# Patient Record
Sex: Female | Born: 1980 | Race: White | Hispanic: No | Marital: Single | Smoking: Current some day smoker
Health system: Southern US, Community
[De-identification: ages and names within clinical notes are randomized; demographics above are authoritative.]

## PROBLEM LIST (undated history)

## (undated) DIAGNOSIS — F32A Depression, unspecified: Secondary | ICD-10-CM

## (undated) DIAGNOSIS — F329 Major depressive disorder, single episode, unspecified: Secondary | ICD-10-CM

## (undated) DIAGNOSIS — F419 Anxiety disorder, unspecified: Secondary | ICD-10-CM

## (undated) DIAGNOSIS — N83209 Unspecified ovarian cyst, unspecified side: Secondary | ICD-10-CM

## (undated) HISTORY — PX: TUBAL LIGATION: SHX77

---
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
## 2001-07-29 ENCOUNTER — Emergency Department (HOSPITAL_COMMUNITY): Admission: EM | Admit: 2001-07-29 | Discharge: 2001-07-29 | Payer: Self-pay | Admitting: Emergency Medicine

## 2001-07-29 ENCOUNTER — Encounter: Payer: Self-pay | Admitting: *Deleted

## 2001-09-06 ENCOUNTER — Emergency Department (HOSPITAL_COMMUNITY): Admission: AC | Admit: 2001-09-06 | Discharge: 2001-09-06 | Payer: Self-pay

## 2001-09-06 ENCOUNTER — Encounter: Payer: Self-pay | Admitting: Emergency Medicine

## 2002-04-10 ENCOUNTER — Emergency Department (HOSPITAL_COMMUNITY): Admission: EM | Admit: 2002-04-10 | Discharge: 2002-04-10 | Payer: Self-pay | Admitting: Emergency Medicine

## 2002-05-15 ENCOUNTER — Inpatient Hospital Stay (HOSPITAL_COMMUNITY): Admission: AD | Admit: 2002-05-15 | Discharge: 2002-05-15 | Payer: Self-pay | Admitting: *Deleted

## 2002-09-21 ENCOUNTER — Emergency Department (HOSPITAL_COMMUNITY): Admission: EM | Admit: 2002-09-21 | Discharge: 2002-09-21 | Payer: Self-pay | Admitting: Emergency Medicine

## 2002-11-02 ENCOUNTER — Emergency Department (HOSPITAL_COMMUNITY): Admission: EM | Admit: 2002-11-02 | Discharge: 2002-11-02 | Payer: Self-pay | Admitting: Emergency Medicine

## 2002-11-07 ENCOUNTER — Emergency Department (HOSPITAL_COMMUNITY): Admission: EM | Admit: 2002-11-07 | Discharge: 2002-11-07 | Payer: Self-pay | Admitting: Emergency Medicine

## 2002-12-07 ENCOUNTER — Encounter: Payer: Self-pay | Admitting: Emergency Medicine

## 2002-12-07 ENCOUNTER — Emergency Department (HOSPITAL_COMMUNITY): Admission: EM | Admit: 2002-12-07 | Discharge: 2002-12-07 | Payer: Self-pay | Admitting: Emergency Medicine

## 2002-12-15 ENCOUNTER — Emergency Department (HOSPITAL_COMMUNITY): Admission: EM | Admit: 2002-12-15 | Discharge: 2002-12-15 | Payer: Self-pay | Admitting: Emergency Medicine

## 2003-01-10 ENCOUNTER — Encounter: Payer: Self-pay | Admitting: Emergency Medicine

## 2003-01-10 ENCOUNTER — Emergency Department (HOSPITAL_COMMUNITY): Admission: EM | Admit: 2003-01-10 | Discharge: 2003-01-10 | Payer: Self-pay | Admitting: Emergency Medicine

## 2003-03-13 ENCOUNTER — Encounter: Payer: Self-pay | Admitting: Emergency Medicine

## 2003-03-13 ENCOUNTER — Emergency Department (HOSPITAL_COMMUNITY): Admission: EM | Admit: 2003-03-13 | Discharge: 2003-03-13 | Payer: Self-pay | Admitting: Emergency Medicine

## 2003-03-15 ENCOUNTER — Inpatient Hospital Stay (HOSPITAL_COMMUNITY): Admission: AD | Admit: 2003-03-15 | Discharge: 2003-03-15 | Payer: Self-pay | Admitting: *Deleted

## 2003-08-25 ENCOUNTER — Inpatient Hospital Stay (HOSPITAL_COMMUNITY): Admission: AD | Admit: 2003-08-25 | Discharge: 2003-08-25 | Payer: Self-pay | Admitting: Family Medicine

## 2003-11-08 ENCOUNTER — Emergency Department (HOSPITAL_COMMUNITY): Admission: EM | Admit: 2003-11-08 | Discharge: 2003-11-08 | Payer: Self-pay | Admitting: Emergency Medicine

## 2003-11-09 ENCOUNTER — Inpatient Hospital Stay (HOSPITAL_COMMUNITY): Admission: AD | Admit: 2003-11-09 | Discharge: 2003-11-09 | Payer: Self-pay | Admitting: Obstetrics and Gynecology

## 2004-04-17 ENCOUNTER — Emergency Department (HOSPITAL_COMMUNITY): Admission: EM | Admit: 2004-04-17 | Discharge: 2004-04-17 | Payer: Self-pay | Admitting: Emergency Medicine

## 2004-04-21 ENCOUNTER — Emergency Department (HOSPITAL_COMMUNITY): Admission: EM | Admit: 2004-04-21 | Discharge: 2004-04-21 | Payer: Self-pay | Admitting: *Deleted

## 2004-05-23 ENCOUNTER — Emergency Department (HOSPITAL_COMMUNITY): Admission: EM | Admit: 2004-05-23 | Discharge: 2004-05-23 | Payer: Self-pay | Admitting: Emergency Medicine

## 2004-05-24 ENCOUNTER — Emergency Department (HOSPITAL_COMMUNITY): Admission: EM | Admit: 2004-05-24 | Discharge: 2004-05-24 | Payer: Self-pay | Admitting: Emergency Medicine

## 2004-05-27 ENCOUNTER — Emergency Department (HOSPITAL_COMMUNITY): Admission: EM | Admit: 2004-05-27 | Discharge: 2004-05-27 | Payer: Self-pay | Admitting: Emergency Medicine

## 2004-08-05 ENCOUNTER — Emergency Department (HOSPITAL_COMMUNITY): Admission: EM | Admit: 2004-08-05 | Discharge: 2004-08-05 | Payer: Self-pay | Admitting: Emergency Medicine

## 2005-07-06 ENCOUNTER — Emergency Department (HOSPITAL_COMMUNITY): Admission: EM | Admit: 2005-07-06 | Discharge: 2005-07-07 | Payer: Self-pay | Admitting: Emergency Medicine

## 2005-12-23 ENCOUNTER — Emergency Department (HOSPITAL_COMMUNITY): Admission: EM | Admit: 2005-12-23 | Discharge: 2005-12-24 | Payer: Self-pay | Admitting: Emergency Medicine

## 2006-05-27 ENCOUNTER — Emergency Department (HOSPITAL_COMMUNITY): Admission: EM | Admit: 2006-05-27 | Discharge: 2006-05-27 | Payer: Self-pay | Admitting: Emergency Medicine

## 2006-05-30 ENCOUNTER — Emergency Department (HOSPITAL_COMMUNITY): Admission: EM | Admit: 2006-05-30 | Discharge: 2006-05-30 | Payer: Self-pay | Admitting: Emergency Medicine

## 2006-06-14 ENCOUNTER — Emergency Department (HOSPITAL_COMMUNITY): Admission: EM | Admit: 2006-06-14 | Discharge: 2006-06-14 | Payer: Self-pay | Admitting: Emergency Medicine

## 2006-10-05 ENCOUNTER — Emergency Department (HOSPITAL_COMMUNITY): Admission: EM | Admit: 2006-10-05 | Discharge: 2006-10-05 | Payer: Self-pay | Admitting: Emergency Medicine

## 2006-12-05 ENCOUNTER — Emergency Department (HOSPITAL_COMMUNITY): Admission: EM | Admit: 2006-12-05 | Discharge: 2006-12-05 | Payer: Self-pay | Admitting: Emergency Medicine

## 2007-05-06 ENCOUNTER — Emergency Department (HOSPITAL_COMMUNITY): Admission: EM | Admit: 2007-05-06 | Discharge: 2007-05-06 | Payer: Self-pay | Admitting: Emergency Medicine

## 2007-05-18 ENCOUNTER — Emergency Department (HOSPITAL_COMMUNITY): Admission: EM | Admit: 2007-05-18 | Discharge: 2007-05-18 | Payer: Self-pay | Admitting: Emergency Medicine

## 2007-09-18 ENCOUNTER — Emergency Department (HOSPITAL_COMMUNITY): Admission: EM | Admit: 2007-09-18 | Discharge: 2007-09-18 | Payer: Self-pay | Admitting: Emergency Medicine

## 2008-04-29 ENCOUNTER — Emergency Department (HOSPITAL_COMMUNITY): Admission: EM | Admit: 2008-04-29 | Discharge: 2008-04-30 | Payer: Self-pay | Admitting: Emergency Medicine

## 2008-04-30 ENCOUNTER — Emergency Department (HOSPITAL_COMMUNITY): Admission: EM | Admit: 2008-04-30 | Discharge: 2008-04-30 | Payer: Self-pay | Admitting: Emergency Medicine

## 2008-07-24 ENCOUNTER — Inpatient Hospital Stay (HOSPITAL_COMMUNITY): Admission: AD | Admit: 2008-07-24 | Discharge: 2008-07-24 | Payer: Self-pay | Admitting: Obstetrics and Gynecology

## 2008-10-19 ENCOUNTER — Inpatient Hospital Stay (HOSPITAL_COMMUNITY): Admission: AD | Admit: 2008-10-19 | Discharge: 2008-10-20 | Payer: Self-pay | Admitting: Obstetrics & Gynecology

## 2008-11-02 ENCOUNTER — Inpatient Hospital Stay (HOSPITAL_COMMUNITY): Admission: AD | Admit: 2008-11-02 | Discharge: 2008-11-02 | Payer: Self-pay | Admitting: Obstetrics & Gynecology

## 2008-11-24 ENCOUNTER — Ambulatory Visit: Payer: Self-pay | Admitting: Advanced Practice Midwife

## 2008-11-24 ENCOUNTER — Inpatient Hospital Stay (HOSPITAL_COMMUNITY): Admission: AD | Admit: 2008-11-24 | Discharge: 2008-11-24 | Payer: Self-pay | Admitting: Obstetrics & Gynecology

## 2008-12-07 ENCOUNTER — Inpatient Hospital Stay (HOSPITAL_COMMUNITY): Admission: AD | Admit: 2008-12-07 | Discharge: 2008-12-07 | Payer: Self-pay | Admitting: Obstetrics

## 2008-12-08 ENCOUNTER — Inpatient Hospital Stay (HOSPITAL_COMMUNITY): Admission: AD | Admit: 2008-12-08 | Discharge: 2008-12-08 | Payer: Self-pay | Admitting: Obstetrics and Gynecology

## 2008-12-23 ENCOUNTER — Inpatient Hospital Stay (HOSPITAL_COMMUNITY): Admission: AD | Admit: 2008-12-23 | Discharge: 2008-12-23 | Payer: Self-pay | Admitting: Obstetrics

## 2008-12-31 ENCOUNTER — Inpatient Hospital Stay (HOSPITAL_COMMUNITY): Admission: AD | Admit: 2008-12-31 | Discharge: 2009-01-02 | Payer: Self-pay | Admitting: Obstetrics & Gynecology

## 2009-09-19 ENCOUNTER — Encounter: Admission: RE | Admit: 2009-09-19 | Discharge: 2009-09-19 | Payer: Self-pay | Admitting: Family Medicine

## 2009-12-07 ENCOUNTER — Emergency Department (HOSPITAL_COMMUNITY): Admission: EM | Admit: 2009-12-07 | Discharge: 2009-12-08 | Payer: Self-pay | Admitting: Emergency Medicine

## 2009-12-08 ENCOUNTER — Ambulatory Visit (HOSPITAL_COMMUNITY): Admission: RE | Admit: 2009-12-08 | Discharge: 2009-12-08 | Payer: Self-pay | Admitting: Emergency Medicine

## 2010-01-28 ENCOUNTER — Emergency Department (HOSPITAL_COMMUNITY): Admission: EM | Admit: 2010-01-28 | Discharge: 2010-01-28 | Payer: Self-pay | Admitting: Emergency Medicine

## 2010-04-15 ENCOUNTER — Emergency Department (HOSPITAL_COMMUNITY): Admission: EM | Admit: 2010-04-15 | Discharge: 2010-04-15 | Payer: Self-pay | Admitting: Emergency Medicine

## 2010-05-20 ENCOUNTER — Emergency Department (HOSPITAL_COMMUNITY): Admission: EM | Admit: 2010-05-20 | Discharge: 2010-05-20 | Payer: Self-pay | Admitting: Emergency Medicine

## 2010-05-30 ENCOUNTER — Emergency Department (HOSPITAL_COMMUNITY): Admission: EM | Admit: 2010-05-30 | Discharge: 2010-05-30 | Payer: Self-pay | Admitting: Emergency Medicine

## 2010-10-08 LAB — APTT: aPTT: 32 seconds (ref 24–37)

## 2010-10-08 LAB — CBC
HCT: 39.9 % (ref 36.0–46.0)
Hemoglobin: 13.5 g/dL (ref 12.0–15.0)
MCV: 95 fL (ref 78.0–100.0)
Platelets: 347 10*3/uL (ref 150–400)
WBC: 8.4 10*3/uL (ref 4.0–10.5)

## 2010-10-08 LAB — DIFFERENTIAL
Basophils Absolute: 0 10*3/uL (ref 0.0–0.1)
Eosinophils Relative: 1 % (ref 0–5)
Lymphocytes Relative: 22 % (ref 12–46)
Lymphs Abs: 1.8 10*3/uL (ref 0.7–4.0)
Monocytes Absolute: 0.6 10*3/uL (ref 0.1–1.0)
Monocytes Relative: 7 % (ref 3–12)
Neutro Abs: 5.9 10*3/uL (ref 1.7–7.7)

## 2010-10-08 LAB — BASIC METABOLIC PANEL
GFR calc non Af Amer: >60 mL/min (ref >60)
Sodium: 140 mEq/L (ref 135–145)

## 2010-10-08 LAB — WET PREP, GENITAL

## 2010-10-08 LAB — GC/CHLAMYDIA PROBE AMP, GENITAL
Chlamydia, DNA Probe: NEGATIVE
GC Probe Amp, Genital: NEGATIVE

## 2010-10-08 LAB — HCG, QUANTITATIVE, PREGNANCY: hCG, Beta Chain, Quant, S: 115 m[IU]/mL — ABNORMAL HIGH (ref <5)

## 2010-10-08 LAB — PROTIME-INR
INR: 1.02 (ref 0.00–1.49)
Prothrombin Time: 13.6 seconds (ref 11.6–15.2)

## 2010-10-08 LAB — ABO/RH: ABO/RH(D): O POS

## 2010-10-11 LAB — DIFFERENTIAL
Basophils Absolute: 0.1 10*3/uL (ref 0.0–0.1)
Basophils Relative: 1 % (ref 0–1)
Eosinophils Absolute: 0.2 10*3/uL (ref 0.0–0.7)
Eosinophils Relative: 3 % (ref 0–5)
Lymphocytes Relative: 32 % (ref 12–46)
Monocytes Absolute: 0.5 10*3/uL (ref 0.1–1.0)

## 2010-10-11 LAB — URINALYSIS, ROUTINE W REFLEX MICROSCOPIC
Nitrite: NEGATIVE
Specific Gravity, Urine: 1.025 (ref 1.005–1.030)
Urobilinogen, UA: 0.2 mg/dL (ref 0.0–1.0)
pH: 6.5 (ref 5.0–8.0)

## 2010-10-11 LAB — COMPREHENSIVE METABOLIC PANEL
AST: 16 U/L (ref 0–37)
BUN: 8 mg/dL (ref 6–23)
Chloride: 109 mEq/L (ref 96–112)
GFR calc Af Amer: >60 mL/min (ref >60)

## 2010-10-11 LAB — POCT PREGNANCY, URINE: Preg Test, Ur: NEGATIVE

## 2010-10-11 LAB — CBC
Hemoglobin: 13.8 g/dL (ref 12.0–15.0)
Platelets: 386 10*3/uL (ref 150–400)
RBC: 4.39 MIL/uL (ref 3.87–5.11)
RDW: 14.1 % (ref 11.5–15.5)

## 2010-10-12 LAB — COMPREHENSIVE METABOLIC PANEL
ALT: 18 U/L (ref 0–35)
AST: 19 U/L (ref 0–37)
Alkaline Phosphatase: 66 U/L (ref 39–117)
CO2: 26 mEq/L (ref 19–32)
Chloride: 107 mEq/L (ref 96–112)
GFR calc Af Amer: >60 mL/min (ref >60)
GFR calc non Af Amer: >60 mL/min (ref >60)
Potassium: 3.8 mEq/L (ref 3.5–5.1)
Sodium: 142 mEq/L (ref 135–145)
Total Bilirubin: 0.4 mg/dL (ref 0.3–1.2)

## 2010-10-12 LAB — DIFFERENTIAL
Basophils Absolute: 0.1 10*3/uL (ref 0.0–0.1)
Basophils Relative: 1 % (ref 0–1)
Eosinophils Absolute: 0.4 10*3/uL (ref 0.0–0.7)
Eosinophils Relative: 5 % (ref 0–5)
Lymphs Abs: 2.7 10*3/uL (ref 0.7–4.0)

## 2010-10-12 LAB — CBC
Hemoglobin: 15.3 g/dL — ABNORMAL HIGH (ref 12.0–15.0)
RBC: 4.9 MIL/uL (ref 3.87–5.11)
WBC: 8.5 10*3/uL (ref 4.0–10.5)

## 2010-10-12 LAB — URINALYSIS, ROUTINE W REFLEX MICROSCOPIC
Bilirubin Urine: NEGATIVE
Hgb urine dipstick: NEGATIVE
Ketones, ur: NEGATIVE mg/dL
Specific Gravity, Urine: 1.015 (ref 1.005–1.030)
Urobilinogen, UA: 0.2 mg/dL (ref 0.0–1.0)

## 2010-10-12 LAB — LIPASE, BLOOD: Lipase: 50 U/L (ref 11–59)

## 2010-11-02 LAB — CBC
HCT: 29.7 % — ABNORMAL LOW (ref 36.0–46.0)
Hemoglobin: 11.6 g/dL — ABNORMAL LOW (ref 12.0–15.0)
MCHC: 35 g/dL (ref 30.0–36.0)
MCV: 96.7 fL (ref 78.0–100.0)
MCV: 97 fL (ref 78.0–100.0)
Platelets: 286 10*3/uL (ref 150–400)
RBC: 3.43 MIL/uL — ABNORMAL LOW (ref 3.87–5.11)
RDW: 13.3 % (ref 11.5–15.5)

## 2010-11-02 LAB — RPR: RPR Ser Ql: NONREACTIVE

## 2010-11-03 LAB — CBC
HCT: 30.9 % — ABNORMAL LOW (ref 36.0–46.0)
MCV: 97 fL (ref 78.0–100.0)
RBC: 3.18 MIL/uL — ABNORMAL LOW (ref 3.87–5.11)
WBC: 11.1 10*3/uL — ABNORMAL HIGH (ref 4.0–10.5)

## 2010-11-03 LAB — URINALYSIS, ROUTINE W REFLEX MICROSCOPIC
Glucose, UA: NEGATIVE mg/dL
Hgb urine dipstick: NEGATIVE
Ketones, ur: NEGATIVE mg/dL
Nitrite: NEGATIVE
Nitrite: NEGATIVE
Protein, ur: NEGATIVE mg/dL
Specific Gravity, Urine: 1.015 (ref 1.005–1.030)
Urobilinogen, UA: 0.2 mg/dL (ref 0.0–1.0)

## 2010-11-03 LAB — COMPREHENSIVE METABOLIC PANEL
BUN: 3 mg/dL — ABNORMAL LOW (ref 6–23)
CO2: 22 mEq/L (ref 19–32)
Chloride: 109 mEq/L (ref 96–112)
Creatinine, Ser: 0.33 mg/dL — ABNORMAL LOW (ref 0.4–1.2)
GFR calc non Af Amer: >60 mL/min (ref >60)
Total Bilirubin: 0.2 mg/dL — ABNORMAL LOW (ref 0.3–1.2)

## 2010-11-03 LAB — WET PREP, GENITAL

## 2010-11-03 LAB — URIC ACID: Uric Acid, Serum: 3.3 mg/dL (ref 2.4–7.0)

## 2010-11-04 LAB — URINALYSIS, ROUTINE W REFLEX MICROSCOPIC
Glucose, UA: 100 mg/dL — AB
Nitrite: NEGATIVE
Protein, ur: NEGATIVE mg/dL
Urobilinogen, UA: 0.2 mg/dL (ref 0.0–1.0)

## 2010-11-04 LAB — STREP B DNA PROBE: Strep Group B Ag: NEGATIVE

## 2010-11-04 LAB — GC/CHLAMYDIA PROBE AMP, GENITAL
Chlamydia, DNA Probe: NEGATIVE
GC Probe Amp, Genital: NEGATIVE

## 2010-11-04 LAB — WET PREP, GENITAL: Yeast Wet Prep HPF POC: NONE SEEN

## 2010-11-04 LAB — FETAL FIBRONECTIN: Fetal Fibronectin: NEGATIVE

## 2010-11-05 LAB — URINE CULTURE: Culture: NO GROWTH

## 2010-11-05 LAB — URINALYSIS, ROUTINE W REFLEX MICROSCOPIC
Bilirubin Urine: NEGATIVE
Ketones, ur: NEGATIVE mg/dL
Nitrite: NEGATIVE
Protein, ur: NEGATIVE mg/dL

## 2010-11-05 LAB — URINE MICROSCOPIC-ADD ON: RBC / HPF: NONE SEEN RBC/hpf (ref <3)

## 2010-11-30 ENCOUNTER — Inpatient Hospital Stay (HOSPITAL_COMMUNITY)
Admission: AD | Admit: 2010-11-30 | Discharge: 2010-12-01 | Disposition: A | Discharge status: Home / Self Care | Payer: Self-pay | Source: Ambulatory Visit | Attending: Obstetrics & Gynecology | Admitting: Obstetrics & Gynecology

## 2010-11-30 DIAGNOSIS — O200 Threatened abortion: Secondary | ICD-10-CM | POA: Insufficient documentation

## 2010-12-01 ENCOUNTER — Inpatient Hospital Stay (HOSPITAL_COMMUNITY): Payer: Self-pay

## 2010-12-01 LAB — CBC
HCT: 34.5 % — ABNORMAL LOW (ref 36.0–46.0)
Platelets: 270 10*3/uL (ref 150–400)
RDW: 12.9 % (ref 11.5–15.5)
WBC: 7.3 10*3/uL (ref 4.0–10.5)

## 2010-12-01 LAB — HCG, QUANTITATIVE, PREGNANCY: hCG, Beta Chain, Quant, S: 42553 m[IU]/mL — ABNORMAL HIGH (ref <5)

## 2010-12-01 LAB — ABO/RH: ABO/RH(D): O POS

## 2010-12-08 NOTE — H&P (Signed)
Beverly Mason, STANKOWSKI NO.:  [id number]   MEDICAL RECORD NO.:  [id number]          PATIENT TYPE:   LOCATION:                                FACILITY:  [facility]   PHYSICIAN:  Lenoard Aden, M.D.     DATE OF BIRTH:   DATE OF ADMISSION:  07/24/2008  DATE OF DISCHARGE:                              HISTORY & PHYSICAL   CHIEF COMPLAINT:  Pelvic pain.   This 30 year old white female G2, P1 at [redacted] weeks gestation with lower  abdominal cramping who presents by EMS for  this complaint.   MEDICATIONS:  __________   SOCIAL HISTORY:  She is a nonsmoker, nondrinker.  She denies domestic  violence. __________history of alcohol abuse.  Lives in __________.   PAST MEDICAL HISTORY:  Bipolar disorder.   She has a history of vaginal delivery.   ALLERGIES:  She has allergies to aspirin and PENICILLIN.   PHYSICAL EXAMINATION:  GENERAL:  The patient is a complicated white  female in no acute distress.  VITAL SIGNS:  Stable.  She is afebrile.  HEENT:  Normal.  LUNGS:  Clear.  ABDOMEN:  Soft, gravid, and nontender to palpation. No rebound, guarding  __________   IMPRESSION:  16 week OB who has alcohol intoxication __________   Urinalysis negative.  __________.   PLAN:  Do tox screen and discharge  home.      Lenoard Aden, M.D.  Electronically Signed     RJT/MEDQ  D:  07/24/2008  T:  07/25/2008  Job:  [id number]

## 2010-12-08 NOTE — Consult Note (Signed)
Beverly Mason, Beverly Mason              ACCOUNT NO.:  [id number]   MEDICAL RECORD NO.:  [id number]          PATIENT TYPE:  [facility]   LOCATION:  [facility]                          FACILITY:  [facility]   PHYSICIAN:  Lenoard Aden, M.D.DATE OF BIRTH:  Dec 23, 1980   DATE OF CONSULTATION:  11/02/2008  DATE OF DISCHARGE:  11/02/2008                                 CONSULTATION   CHIEF COMPLAINT:  Lower abdominal pain.   HISTORY OF PRESENT ILLNESS:  She is a 30 year old G2, P1 at 96 plus  weeks' gestation, who presents with questionably leakage of fluid and  abdominal discomfort this evening.   PAST MEDICAL HISTORY:  Remarkable for pneumonia, varicella, bipolar  disorder, UTI, history of term vaginal delivery complicated by  preeclampsia in 2005.   FAMILY HISTORY:  Noncontributory.   SOCIAL HISTORY:  She is a nonsmoker.  She denies domestic or physical  violence.   PHYSICAL EXAMINATION:  VITAL SIGNS:  Stable.  GENERAL:  The patient is afebrile.  HEENT:  Normal.  LUNGS:  Clear.  HEART:  Regular rhythm.  ABDOMEN:  Soft, gravid, and nontender.  BACK:  No CVA tenderness.  EXTREMITIES:  There are no cords.  NEUROLOGIC:  Nonfocal.  SKIN:  Intact.   Cervix was closed, long and out of the pelvis, contractions are  irregular.  NST is reassuring to reactive.  Ultrasound reveals an  otherwise normal-appearing fetus growth.  Scan was not.  AFI is 6.3.  Fetal fibronectin was negative.  Urinalysis and wet prep negative.  GC  chlamydia pending.   IMPRESSION:  1. A 30-week intrauterine pregnancy.  2. Preterm contractions.  No other cervical change.  On serial exam,      negative fetal fibronectin.  3. New-onset oligohydramnios, questionable etiology.   PLAN:  Follow up in the office for full obstetrical ultrasound within  the week to check AFI and check growth.  Preterm labor warnings given,  fetal activity discussed.  The patient acknowledges, we will proceed.      Lenoard Aden, M.D.  Electronically Signed     RJT/MEDQ  D:  11/02/2008  T:  11/03/2008  Job:  [id number]

## 2011-02-11 ENCOUNTER — Other Ambulatory Visit: Payer: Self-pay | Admitting: Advanced Practice Midwife

## 2011-02-11 ENCOUNTER — Inpatient Hospital Stay (HOSPITAL_COMMUNITY): Payer: Medicaid Other

## 2011-02-11 ENCOUNTER — Encounter (HOSPITAL_COMMUNITY): Payer: Self-pay | Admitting: *Deleted

## 2011-02-11 ENCOUNTER — Inpatient Hospital Stay (HOSPITAL_COMMUNITY)
Admission: AD | Admit: 2011-02-11 | Discharge: 2011-02-12 | Disposition: A | Discharge status: Home / Self Care | Payer: Medicaid Other | Source: Ambulatory Visit | Attending: Family Medicine | Admitting: Family Medicine

## 2011-02-11 DIAGNOSIS — O4690 Antepartum hemorrhage, unspecified, unspecified trimester: Secondary | ICD-10-CM

## 2011-02-11 DIAGNOSIS — O209 Hemorrhage in early pregnancy, unspecified: Secondary | ICD-10-CM | POA: Insufficient documentation

## 2011-02-11 DIAGNOSIS — R109 Unspecified abdominal pain: Secondary | ICD-10-CM | POA: Insufficient documentation

## 2011-02-11 DIAGNOSIS — Z3490 Encounter for supervision of normal pregnancy, unspecified, unspecified trimester: Secondary | ICD-10-CM

## 2011-02-11 DIAGNOSIS — M549 Dorsalgia, unspecified: Secondary | ICD-10-CM | POA: Insufficient documentation

## 2011-02-11 LAB — URINE MICROSCOPIC-ADD ON

## 2011-02-11 LAB — URINALYSIS, ROUTINE W REFLEX MICROSCOPIC
Bilirubin Urine: NEGATIVE
Glucose, UA: NEGATIVE mg/dL
Ketones, ur: NEGATIVE mg/dL
pH: 7 (ref 5.0–8.0)

## 2011-02-11 LAB — WET PREP, GENITAL: Yeast Wet Prep HPF POC: NONE SEEN

## 2011-02-11 MED ORDER — OXYCODONE-ACETAMINOPHEN 5-325 MG PO TABS
2.0000 | ORAL_TABLET | Freq: Once | ORAL | Status: AC
  Administered 2011-02-11: 2 via ORAL
  Filled 2011-02-11: qty 2

## 2011-02-11 NOTE — Progress Notes (Signed)
Had Dr. Visit Monday.  Started bleeding Tuesday.  Called the Dr. Isidore Moos and they told her to come in.  States bleeding is random, sometimes a couple teaspoons sometimes none

## 2011-02-11 NOTE — ED Provider Notes (Signed)
History     Chief Complaint  Patient presents with  . Abdominal Pain   HPI The patient is a 30 y.o. G3P2002 at [redacted]w[redacted]d with c/o vaginal bleeding and low abd/back pain. Pt reports bleeding is intermittent and started on Tuesday. Had intercourse on Tuesday, bleeding occurred prior to IC, then stopped, then started again after IC. Pt reports she was told that she had a low lying placenta covering about 1/4 of her cervix that was seen on her anatomy u/s. She receives prenatal care in [place]. Last visit on Monday.   OB History    Grav Para Term Preterm Abortions TAB SAB Ect Mult Living   3 2 2  0 0 0 0 0 0 2      No past medical history on file.  No past surgical history on file.  No family history on file.  History  Substance Use Topics  . Smoking status: Not on file  . Smokeless tobacco: Not on file  . Alcohol Use:     Allergies:  Allergies  Allergen Reactions  . Aspirin     Nausea and vomiting  . Ibuprofen     Nausea and vomiting    Prescriptions prior to admission  Medication Sig Dispense Refill  . acetaminophen (TYLENOL) 325 MG tablet Take 650 mg by mouth every 6 (six) hours as needed. For headache or pain       . prenatal vitamin w/FE, FA (PRENATAL 1 + 1) 27-1 MG TABS Take 1 tablet by mouth daily.          Review of Systems  Constitutional: Negative.   Respiratory: Negative.   Cardiovascular: Negative.   Gastrointestinal: Negative for nausea, vomiting, abdominal pain, diarrhea and constipation.  Genitourinary: Negative for dysuria, urgency, frequency, hematuria and flank pain.       Positive for vaginal bleeding and cramping  Musculoskeletal: Positive for back pain.  Neurological: Negative.   Psychiatric/Behavioral: Negative.    Physical Exam   Blood pressure 110/69, pulse 99, temperature 99 F (37.2 C), temperature source Oral, resp. rate 20, height 5\' 5"  (1.651 m), weight 62.506 kg (137 lb 12.8 oz), last menstrual period 08/31/2010.  Physical Exam    Nursing note and vitals reviewed. Constitutional: She is oriented to person, place, and time. She appears well-developed and well-nourished. No distress.  HENT:  Head: Normocephalic and atraumatic.  Cardiovascular: Normal rate.   Respiratory: Effort normal.  GI: Soft. Bowel sounds are normal. She exhibits no mass. There is no tenderness. There is no rebound and no guarding.  Genitourinary: There is no rash or lesion on the right labia. There is no rash or lesion on the left labia. Uterus is not tender. Enlarged: Size c/w dates. Cervix exhibits no motion tenderness, no discharge and no friability. Right adnexum displays no mass, no tenderness and no fullness. Left adnexum displays no mass, no tenderness and no fullness. No tenderness or bleeding around the vagina. Vaginal discharge (creamy brown discharge) found.       Cervix visually closed  Musculoskeletal: Normal range of motion.  Neurological: She is alert and oriented to person, place, and time.  Skin: Skin is warm and dry.  Psychiatric: She has a normal mood and affect.    MAU Course  Procedures Urine GC/CT collected Wet prep collected U/S ordered to eval placenta  Results for orders placed during the hospital encounter of 02/11/11 (from the past 24 hour(s))  WET PREP, GENITAL     Status: Abnormal   Collection Time  02/11/11  8:20 PM      Component Value Range   Yeast, Wet Prep NONE SEEN  NONE SEEN    Trich, Wet Prep NONE SEEN  NONE SEEN    Clue Cells, Wet Prep NONE SEEN  NONE SEEN    WBC, Wet Prep HPF POC FEW (*) NONE SEEN   URINALYSIS, ROUTINE W REFLEX MICROSCOPIC     Status: Abnormal   Collection Time   02/11/11 10:04 PM      Component Value Range   Color, Urine YELLOW  YELLOW    Appearance CLOUDY (*) CLEAR    Specific Gravity, Urine 1.015  1.005 - 1.030    pH 7.0  5.0 - 8.0    Glucose, UA NEGATIVE  NEGATIVE (mg/dL)   Hgb urine dipstick TRACE (*) NEGATIVE    Bilirubin Urine NEGATIVE  NEGATIVE    Ketones, ur NEGATIVE   NEGATIVE (mg/dL)   Protein, ur NEGATIVE  NEGATIVE (mg/dL)   Urobilinogen, UA 0.2  0.0 - 1.0 (mg/dL)   Nitrite NEGATIVE  NEGATIVE    Leukocytes, UA NEGATIVE  NEGATIVE   URINE MICROSCOPIC-ADD ON     Status: Abnormal   Collection Time   02/11/11 10:04 PM      Component Value Range   Squamous Epithelial / LPF RARE  RARE    WBC, UA 0-2  <3 (WBC/hpf)   Bacteria, UA MANY (*) RARE    Urine-Other MUCOUS PRESENT      U/S WNL, placenta well away from internal os, no abruption, EFW 54%ile, AFV normal, cervical length 4 cm    Assessment and Plan  30 yo G3P2002 @ [redacted]w[redacted]d Bleeding in pregnancy Rev'd precautions, f/u with regular provider as scheduled  Rickey Farrier 02/11/2011, 10:32 PM

## 2011-02-12 LAB — GC/CHLAMYDIA PROBE AMP, URINE: Chlamydia, Swab/Urine, PCR: NEGATIVE

## 2011-04-26 LAB — CBC
MCHC: 34.7
Platelets: 412 — ABNORMAL HIGH
RBC: 3.93
WBC: 11.5 — ABNORMAL HIGH

## 2011-04-26 LAB — HCG, QUANTITATIVE, PREGNANCY: hCG, Beta Chain, Quant, S: 138 — ABNORMAL HIGH

## 2011-04-26 LAB — DIFFERENTIAL
Basophils Relative: 1
Lymphs Abs: 2
Monocytes Relative: 6
Neutro Abs: 8.4 — ABNORMAL HIGH
Neutrophils Relative %: 73

## 2011-04-26 LAB — GC/CHLAMYDIA PROBE AMP, GENITAL: GC Probe Amp, Genital: NEGATIVE

## 2011-04-26 LAB — ABO/RH: ABO/RH(D): O POS

## 2011-04-26 LAB — BASIC METABOLIC PANEL
BUN: 9
Calcium: 8.7
Creatinine, Ser: 0.47
GFR calc Af Amer: >60

## 2011-04-29 LAB — DIFFERENTIAL
Basophils Absolute: 0 10*3/uL (ref 0.0–0.1)
Basophils Relative: 0 % (ref 0–1)
Eosinophils Absolute: 0.1 10*3/uL (ref 0.0–0.7)
Eosinophils Relative: 1 % (ref 0–5)
Lymphs Abs: 1.9 10*3/uL (ref 0.7–4.0)
Neutrophils Relative %: 68 % (ref 43–77)

## 2011-04-29 LAB — URINALYSIS, ROUTINE W REFLEX MICROSCOPIC
Glucose, UA: NEGATIVE mg/dL
Hgb urine dipstick: NEGATIVE
Ketones, ur: NEGATIVE mg/dL
Protein, ur: NEGATIVE mg/dL
pH: 5 (ref 5.0–8.0)

## 2011-04-29 LAB — CBC
HCT: 33.8 % — ABNORMAL LOW (ref 36.0–46.0)
MCV: 93.9 fL (ref 78.0–100.0)
Platelets: 294 10*3/uL (ref 150–400)
RDW: 12.5 % (ref 11.5–15.5)
WBC: 7.6 10*3/uL (ref 4.0–10.5)

## 2011-04-29 LAB — RAPID URINE DRUG SCREEN, HOSP PERFORMED
Barbiturates: NOT DETECTED
Benzodiazepines: NOT DETECTED
Cocaine: NOT DETECTED

## 2011-04-29 LAB — WET PREP, GENITAL

## 2011-05-05 LAB — URINALYSIS, ROUTINE W REFLEX MICROSCOPIC
Bilirubin Urine: NEGATIVE
Hgb urine dipstick: NEGATIVE
Nitrite: NEGATIVE
Specific Gravity, Urine: 1.01
pH: 7

## 2011-05-05 LAB — DIFFERENTIAL
Basophils Relative: 1
Lymphs Abs: 2.2
Monocytes Absolute: 0.4
Monocytes Relative: 8
Neutro Abs: 2.3
Neutrophils Relative %: 43

## 2011-05-05 LAB — CBC
Hemoglobin: 12.5
MCHC: 33.8
RBC: 3.98
WBC: 5.3

## 2011-05-05 LAB — PREGNANCY, URINE: Preg Test, Ur: NEGATIVE

## 2011-05-06 LAB — CBC
MCHC: 34.4
Platelets: 277
RDW: 13.1

## 2011-05-06 LAB — DIFFERENTIAL
Basophils Relative: 0
Eosinophils Absolute: 0.2
Lymphs Abs: 2.2
Monocytes Absolute: 0.4
Monocytes Relative: 7
Neutro Abs: 3.1
Neutrophils Relative %: 52

## 2011-05-06 LAB — WET PREP, GENITAL
Trich, Wet Prep: NONE SEEN
WBC, Wet Prep HPF POC: NONE SEEN
Yeast Wet Prep HPF POC: NONE SEEN

## 2011-05-06 LAB — COMPREHENSIVE METABOLIC PANEL
ALT: 10
Albumin: 4.1
Alkaline Phosphatase: 35 — ABNORMAL LOW
Calcium: 8.9
Glucose, Bld: 85
Potassium: 4.4
Sodium: 138
Total Protein: 6.6

## 2011-05-06 LAB — URINALYSIS, ROUTINE W REFLEX MICROSCOPIC
Glucose, UA: NEGATIVE
Ketones, ur: NEGATIVE
Nitrite: NEGATIVE
Specific Gravity, Urine: 1.02
pH: 8

## 2011-05-06 LAB — GC/CHLAMYDIA PROBE AMP, GENITAL: Chlamydia, DNA Probe: NEGATIVE

## 2011-05-06 LAB — PREGNANCY, URINE: Preg Test, Ur: NEGATIVE

## 2011-06-27 ENCOUNTER — Emergency Department (HOSPITAL_COMMUNITY): Payer: Medicaid Other

## 2011-06-27 ENCOUNTER — Encounter (HOSPITAL_COMMUNITY): Payer: Self-pay | Admitting: Emergency Medicine

## 2011-06-27 ENCOUNTER — Emergency Department (HOSPITAL_COMMUNITY)
Admission: EM | Admit: 2011-06-27 | Discharge: 2011-06-27 | Disposition: A | Discharge status: Home / Self Care | Payer: Medicaid Other | Attending: Emergency Medicine | Admitting: Emergency Medicine

## 2011-06-27 DIAGNOSIS — N949 Unspecified condition associated with female genital organs and menstrual cycle: Secondary | ICD-10-CM | POA: Insufficient documentation

## 2011-06-27 DIAGNOSIS — F1010 Alcohol abuse, uncomplicated: Secondary | ICD-10-CM | POA: Insufficient documentation

## 2011-06-27 DIAGNOSIS — R109 Unspecified abdominal pain: Secondary | ICD-10-CM | POA: Insufficient documentation

## 2011-06-27 DIAGNOSIS — N939 Abnormal uterine and vaginal bleeding, unspecified: Secondary | ICD-10-CM

## 2011-06-27 DIAGNOSIS — N898 Other specified noninflammatory disorders of vagina: Secondary | ICD-10-CM | POA: Insufficient documentation

## 2011-06-27 DIAGNOSIS — M25539 Pain in unspecified wrist: Secondary | ICD-10-CM | POA: Insufficient documentation

## 2011-06-27 DIAGNOSIS — M25439 Effusion, unspecified wrist: Secondary | ICD-10-CM | POA: Insufficient documentation

## 2011-06-27 DIAGNOSIS — S63509A Unspecified sprain of unspecified wrist, initial encounter: Secondary | ICD-10-CM | POA: Insufficient documentation

## 2011-06-27 DIAGNOSIS — F10929 Alcohol use, unspecified with intoxication, unspecified: Secondary | ICD-10-CM

## 2011-06-27 LAB — CBC
Hemoglobin: 13.8 g/dL (ref 12.0–15.0)
MCH: 31.7 pg (ref 26.0–34.0)
MCHC: 34.2 g/dL (ref 30.0–36.0)
MCV: 92.7 fL (ref 78.0–100.0)
RBC: 4.36 MIL/uL (ref 3.87–5.11)

## 2011-06-27 LAB — BASIC METABOLIC PANEL
BUN: 7 mg/dL (ref 6–23)
CO2: 22 mEq/L (ref 19–32)
Calcium: 8.8 mg/dL (ref 8.4–10.5)
Creatinine, Ser: 0.54 mg/dL (ref 0.50–1.10)
GFR calc non Af Amer: >90 mL/min (ref >90)
Glucose, Bld: 87 mg/dL (ref 70–99)
Sodium: 143 mEq/L (ref 135–145)

## 2011-06-27 MED ORDER — SODIUM CHLORIDE 0.9 % IV BOLUS (SEPSIS)
1000.0000 mL | Freq: Once | INTRAVENOUS | Status: AC
  Administered 2011-06-27: 1000 mL via INTRAVENOUS

## 2011-06-27 MED ORDER — IOHEXOL 300 MG/ML  SOLN
80.0000 mL | Freq: Once | INTRAMUSCULAR | Status: AC | PRN
  Administered 2011-06-27: 80 mL via INTRAVENOUS

## 2011-06-27 MED ORDER — ACETAMINOPHEN 325 MG PO TABS
650.0000 mg | ORAL_TABLET | Freq: Once | ORAL | Status: AC
  Administered 2011-06-27: 650 mg via ORAL
  Filled 2011-06-27: qty 2

## 2011-06-27 NOTE — ED Notes (Signed)
Patient transported to CT 

## 2011-06-27 NOTE — ED Notes (Signed)
EDP notified that pt is requesting tylenol

## 2011-06-27 NOTE — ED Notes (Signed)
Pt notified for 3rd time that urine sample is needed
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
## 2011-06-27 NOTE — ED Notes (Signed)
Re-paged Dr. Jed Limerick to [phone]

## 2011-06-28 LAB — URINE DRUGS OF ABUSE SCREEN W ALC, ROUTINE (REF LAB)
Benzodiazepines.: NEGATIVE
Cocaine Metabolites: NEGATIVE
Creatinine,U: 26.5 mg/dL
Methadone: NEGATIVE
Phencyclidine (PCP): NEGATIVE

## 2011-06-29 ENCOUNTER — Other Ambulatory Visit: Payer: Self-pay | Admitting: Obstetrics & Gynecology

## 2011-06-29 ENCOUNTER — Other Ambulatory Visit (HOSPITAL_COMMUNITY): Payer: Medicaid Other

## 2011-06-29 ENCOUNTER — Inpatient Hospital Stay (HOSPITAL_COMMUNITY): Payer: Medicaid Other

## 2011-06-29 ENCOUNTER — Encounter (HOSPITAL_COMMUNITY): Payer: Self-pay

## 2011-06-29 ENCOUNTER — Ambulatory Visit: Admit: 2011-06-29 | Payer: Self-pay | Admitting: Obstetrics & Gynecology

## 2011-06-29 ENCOUNTER — Ambulatory Visit (HOSPITAL_COMMUNITY)
Admission: AD | Admit: 2011-06-29 | Discharge: 2011-06-29 | Disposition: A | Discharge status: Home / Self Care | Payer: Medicaid Other | Source: Ambulatory Visit | Attending: Obstetrics & Gynecology | Admitting: Obstetrics & Gynecology

## 2011-06-29 ENCOUNTER — Encounter (HOSPITAL_COMMUNITY)
Admission: AD | Disposition: A | Discharge status: Home / Self Care | Payer: Self-pay | Source: Ambulatory Visit | Attending: Obstetrics & Gynecology

## 2011-06-29 DIAGNOSIS — N939 Abnormal uterine and vaginal bleeding, unspecified: Secondary | ICD-10-CM

## 2011-06-29 DIAGNOSIS — Z9889 Other specified postprocedural states: Secondary | ICD-10-CM

## 2011-06-29 DIAGNOSIS — N898 Other specified noninflammatory disorders of vagina: Secondary | ICD-10-CM

## 2011-06-29 HISTORY — DX: Depression, unspecified: F32.A

## 2011-06-29 HISTORY — DX: Major depressive disorder, single episode, unspecified: F32.9

## 2011-06-29 HISTORY — DX: Anxiety disorder, unspecified: F41.9

## 2011-06-29 HISTORY — PX: DILATION AND EVACUATION: SHX1459

## 2011-06-29 LAB — CBC
HCT: 37.4 % (ref 36.0–46.0)
HCT: 31.8 % — ABNORMAL LOW (ref 36.0–46.0)
Hemoglobin: 12 g/dL (ref 12.0–15.0)
Hemoglobin: 10.3 g/dL — ABNORMAL LOW (ref 12.0–15.0)
MCH: 31.1 pg (ref 26.0–34.0)
MCH: 30.7 pg (ref 26.0–34.0)
MCHC: 33.2 g/dL (ref 30.0–36.0)
MCV: 93.5 fL (ref 78.0–100.0)
MCV: 95.4 fL (ref 78.0–100.0)
MCV: 94.9 fL (ref 78.0–100.0)
Platelets: 380 10*3/uL (ref 150–400)
RBC: 4.15 MIL/uL (ref 3.87–5.11)
RBC: 3.35 MIL/uL — ABNORMAL LOW (ref 3.87–5.11)
RDW: 13.8 % (ref 11.5–15.5)
RDW: 13.8 % (ref 11.5–15.5)
WBC: 6.6 10*3/uL (ref 4.0–10.5)

## 2011-06-29 LAB — DIC (DISSEMINATED INTRAVASCULAR COAGULATION)PANEL
Fibrinogen: 292 mg/dL (ref 204–475)
Prothrombin Time: 13.8 seconds (ref 11.6–15.2)
Smear Review: NONE SEEN

## 2011-06-29 LAB — TYPE AND SCREEN: ABO/RH(D): O POS

## 2011-06-29 SURGERY — DILATION AND EVACUATION, UTERUS
Anesthesia: Monitor Anesthesia Care | Site: Vagina | Wound class: Clean Contaminated

## 2011-06-29 MED ORDER — DOXYCYCLINE HYCLATE 100 MG IV SOLR
100.0000 mg | Freq: Once | INTRAVENOUS | Status: AC
  Administered 2011-06-29: 100 mg via INTRAVENOUS
  Filled 2011-06-29: qty 100

## 2011-06-29 MED ORDER — OXYTOCIN 20 UNITS IN LACTATED RINGERS INFUSION - SIMPLE
20.0000 [IU] | INTRAVENOUS | Status: DC
  Administered 2011-06-29: 20 [IU] via INTRAVENOUS

## 2011-06-29 MED ORDER — MISOPROSTOL 200 MCG PO TABS
ORAL_TABLET | ORAL | Status: AC
  Administered 2011-06-29: 800 ug via RECTAL
  Filled 2011-06-29: qty 4

## 2011-06-29 MED ORDER — DOCUSATE SODIUM 100 MG PO CAPS: 100.0000 mg | ORAL_CAPSULE | Freq: Two times a day (BID) | ORAL | Status: AC | PRN

## 2011-06-29 MED ORDER — ACETAMINOPHEN 500 MG PO TABS: 1000.0000 mg | ORAL_TABLET | Freq: Four times a day (QID) | ORAL | Status: DC | PRN

## 2011-06-29 MED ORDER — OXYCODONE-ACETAMINOPHEN 5-325 MG PO TABS: 1.0000 | ORAL_TABLET | Freq: Four times a day (QID) | ORAL | Status: DC | PRN

## 2011-06-29 MED ORDER — MENTHOL 3 MG MT LOZG: 1.0000 | LOZENGE | OROMUCOSAL | Status: DC | PRN

## 2011-06-29 MED ORDER — ONDANSETRON HCL 4 MG PO TABS: 4.0000 mg | ORAL_TABLET | Freq: Four times a day (QID) | ORAL | Status: DC | PRN

## 2011-06-29 MED ORDER — HYDROMORPHONE HCL 2 MG PO TABS
2.0000 mg | ORAL_TABLET | ORAL | Status: DC | PRN
  Administered 2011-06-29: 2 mg via ORAL
  Filled 2011-06-29: qty 1

## 2011-06-29 MED ORDER — DOXYCYCLINE HYCLATE 100 MG IV SOLR
100.0000 mg | Freq: Two times a day (BID) | INTRAVENOUS | Status: DC
  Filled 2011-06-29: qty 100

## 2011-06-29 MED ORDER — KETOROLAC TROMETHAMINE 30 MG/ML IJ SOLN
INTRAMUSCULAR | Status: DC | PRN
  Administered 2011-06-29: 30 mg via INTRAVENOUS

## 2011-06-29 MED ORDER — LIDOCAINE HCL (CARDIAC) 20 MG/ML IV SOLN
INTRAVENOUS | Status: DC | PRN
  Administered 2011-06-29: 40 mg via INTRAVENOUS

## 2011-06-29 MED ORDER — FAMOTIDINE IN NACL 20-0.9 MG/50ML-% IV SOLN
20.0000 mg | Freq: Once | INTRAVENOUS | Status: AC
  Administered 2011-06-29: 20 mg via INTRAVENOUS

## 2011-06-29 MED ORDER — MIDAZOLAM HCL 5 MG/5ML IJ SOLN
INTRAMUSCULAR | Status: DC | PRN
  Administered 2011-06-29: 2 mg via INTRAVENOUS

## 2011-06-29 MED ORDER — FENTANYL CITRATE 0.05 MG/ML IJ SOLN
50.0000 ug | Freq: Once | INTRAMUSCULAR | Status: AC
  Administered 2011-06-29: 50 ug via INTRAVENOUS
  Filled 2011-06-29: qty 2

## 2011-06-29 MED ORDER — HYDROMORPHONE HCL PF 1 MG/ML IJ SOLN
1.0000 mg | INTRAMUSCULAR | Status: DC | PRN
  Administered 2011-06-29 (×2): 1 mg via INTRAVENOUS
  Filled 2011-06-29 (×2): qty 1

## 2011-06-29 MED ORDER — CARBOPROST TROMETHAMINE 250 MCG/ML IM SOLN
250.0000 ug | INTRAMUSCULAR | Status: DC | PRN
  Administered 2011-06-29: 250 ug via INTRAMUSCULAR

## 2011-06-29 MED ORDER — FAMOTIDINE IN NACL 20-0.9 MG/50ML-% IV SOLN
INTRAVENOUS | Status: AC
  Filled 2011-06-29: qty 50

## 2011-06-29 MED ORDER — ONDANSETRON HCL 4 MG/2ML IJ SOLN: 4.0000 mg | Freq: Four times a day (QID) | INTRAMUSCULAR | Status: DC | PRN

## 2011-06-29 MED ORDER — ONDANSETRON HCL 4 MG/2ML IJ SOLN
INTRAMUSCULAR | Status: DC | PRN
  Administered 2011-06-29: 4 mg via INTRAVENOUS

## 2011-06-29 MED ORDER — HYDROMORPHONE HCL PF 1 MG/ML IJ SOLN
INTRAMUSCULAR | Status: AC
  Administered 2011-06-29: 1 mg via INTRAVENOUS
  Filled 2011-06-29: qty 1

## 2011-06-29 MED ORDER — PANTOPRAZOLE SODIUM 40 MG PO TBEC
40.0000 mg | DELAYED_RELEASE_TABLET | Freq: Every day | ORAL | Status: DC
  Administered 2011-06-29: 40 mg via ORAL
  Filled 2011-06-29 (×2): qty 1

## 2011-06-29 MED ORDER — OXYTOCIN 20 UNITS IN LACTATED RINGERS INFUSION - SIMPLE
40.0000 [IU] | INTRAVENOUS | Status: DC
  Filled 2011-06-29: qty 2000

## 2011-06-29 MED ORDER — CARBOPROST TROMETHAMINE 250 MCG/ML IM SOLN: 250.0000 ug | INTRAMUSCULAR | Status: DC | PRN

## 2011-06-29 MED ORDER — OXYCODONE-ACETAMINOPHEN 5-325 MG PO TABS
1.0000 | ORAL_TABLET | ORAL | Status: DC | PRN
  Administered 2011-06-29 (×2): 2 via ORAL
  Filled 2011-06-29 (×2): qty 2

## 2011-06-29 MED ORDER — LACTATED RINGERS IV SOLN: INTRAVENOUS | Status: DC

## 2011-06-29 MED ORDER — PROPOFOL 10 MG/ML IV BOLUS
INTRAVENOUS | Status: DC | PRN
  Administered 2011-06-29 (×2): 50 mg via INTRAVENOUS

## 2011-06-29 MED ORDER — ZOLPIDEM TARTRATE 5 MG PO TABS: 5.0000 mg | ORAL_TABLET | Freq: Every evening | ORAL | Status: DC | PRN

## 2011-06-29 MED ORDER — DOXYCYCLINE HYCLATE 100 MG IV SOLR
100.0000 mg | Freq: Once | INTRAVENOUS | Status: DC
  Filled 2011-06-29: qty 100

## 2011-06-29 MED ORDER — CARBOPROST TROMETHAMINE 250 MCG/ML IM SOLN
INTRAMUSCULAR | Status: AC
  Administered 2011-06-29: 250 ug via INTRAMUSCULAR
  Filled 2011-06-29: qty 1

## 2011-06-29 MED ORDER — IBUPROFEN 600 MG PO TABS: 600.0000 mg | ORAL_TABLET | Freq: Four times a day (QID) | ORAL | Status: DC | PRN

## 2011-06-29 MED ORDER — LIDOCAINE HCL 1 % IJ SOLN
INTRAMUSCULAR | Status: DC | PRN
  Administered 2011-06-29: 10 mL

## 2011-06-29 MED ORDER — HYDROMORPHONE HCL 2 MG PO TABS: 2.0000 mg | ORAL_TABLET | ORAL | Status: AC | PRN

## 2011-06-29 MED ORDER — MIDAZOLAM HCL 2 MG/2ML IJ SOLN
0.5000 mg | Freq: Once | INTRAMUSCULAR | Status: AC
  Administered 2011-06-29: 0.5 mg via INTRAVENOUS

## 2011-06-29 MED ORDER — FENTANYL CITRATE 0.05 MG/ML IJ SOLN
INTRAMUSCULAR | Status: DC | PRN
  Administered 2011-06-29: 100 ug via INTRAVENOUS

## 2011-06-29 MED ORDER — HYDROMORPHONE HCL PF 1 MG/ML IJ SOLN: 0.2500 mg | INTRAMUSCULAR | Status: DC | PRN

## 2011-06-29 MED ORDER — OXYTOCIN 10 UNIT/ML IJ SOLN
20.0000 [IU] | Freq: Once | INTRAMUSCULAR | Status: AC
  Administered 2011-06-29: 20 [IU]

## 2011-06-29 MED ORDER — DIPHENOXYLATE-ATROPINE 2.5-0.025 MG PO TABS: 2.0000 | ORAL_TABLET | Freq: Four times a day (QID) | ORAL | Status: DC | PRN

## 2011-06-29 MED ORDER — HYDROMORPHONE HCL PF 1 MG/ML IJ SOLN
1.0000 mg | INTRAMUSCULAR | Status: DC | PRN
  Administered 2011-06-29 (×3): 1 mg via INTRAVENOUS
  Filled 2011-06-29: qty 1

## 2011-06-29 MED ORDER — OXYTOCIN 10 UNIT/ML IJ SOLN
INTRAMUSCULAR | Status: AC
  Administered 2011-06-29: 20 [IU]
  Filled 2011-06-29: qty 2

## 2011-06-29 MED ORDER — MISOPROSTOL 200 MCG PO TABS
800.0000 ug | ORAL_TABLET | Freq: Once | ORAL | Status: AC
  Administered 2011-06-29: 800 ug via RECTAL

## 2011-06-29 MED ORDER — METHYLERGONOVINE MALEATE 0.2 MG/ML IJ SOLN
0.2000 mg | Freq: Four times a day (QID) | INTRAMUSCULAR | Status: DC | PRN
  Administered 2011-06-29: 0.2 mg via INTRAMUSCULAR

## 2011-06-29 MED ORDER — LACTATED RINGERS IV SOLN
INTRAVENOUS | Status: DC
  Administered 2011-06-29 (×3): via INTRAVENOUS

## 2011-06-29 MED ORDER — DOXYCYCLINE HYCLATE 100 MG PO CAPS: 100.0000 mg | ORAL_CAPSULE | Freq: Two times a day (BID) | ORAL | Status: AC

## 2011-06-29 MED ORDER — MIDAZOLAM HCL 2 MG/2ML IJ SOLN
INTRAMUSCULAR | Status: AC
  Administered 2011-06-29: 0.5 mg via INTRAVENOUS
  Filled 2011-06-29: qty 2

## 2011-06-29 MED ORDER — CLONAZEPAM 1 MG PO TABS: 1.0000 mg | ORAL_TABLET | Freq: Two times a day (BID) | ORAL | Status: DC | PRN

## 2011-06-29 MED ORDER — METHYLERGONOVINE MALEATE 0.2 MG/ML IJ SOLN: 0.2000 mg | INTRAMUSCULAR | Status: DC | PRN

## 2011-06-29 MED ORDER — OXYTOCIN 20 UNITS IN LACTATED RINGERS INFUSION - SIMPLE
INTRAVENOUS | Status: AC
  Administered 2011-06-29: 20 [IU] via INTRAVENOUS
  Filled 2011-06-29: qty 1000

## 2011-06-29 SURGICAL SUPPLY — 19 items
CATH ROBINSON RED A/P 16FR (CATHETERS) ×2 IMPLANT
CLOTH BEACON ORANGE TIMEOUT ST (SAFETY) ×2 IMPLANT
DECANTER SPIKE VIAL GLASS SM (MISCELLANEOUS) ×2 IMPLANT
GLOVE BIO SURGEON STRL SZ7 (GLOVE) ×2 IMPLANT
GLOVE BIOGEL PI IND STRL 7.0 (GLOVE) ×4 IMPLANT
GLOVE BIOGEL PI INDICATOR 7.0 (GLOVE) ×4
GOWN PREVENTION PLUS LG XLONG (DISPOSABLE) ×4 IMPLANT
KIT BERKELEY 1ST TRIMESTER 3/8 (MISCELLANEOUS) ×2 IMPLANT
NEEDLE SPNL 22GX3.5 QUINCKE BK (NEEDLE) ×2 IMPLANT
NS IRRIG 1000ML POUR BTL (IV SOLUTION) ×2 IMPLANT
PACK VAGINAL MINOR WOMEN LF (CUSTOM PROCEDURE TRAY) ×2 IMPLANT
PAD PREP 24X48 CUFFED NSTRL (MISCELLANEOUS) ×2 IMPLANT
SET BERKELEY SUCTION TUBING (SUCTIONS) ×2 IMPLANT
SYR CONTROL 10ML LL (SYRINGE) ×2 IMPLANT
TOWEL OR 17X24 6PK STRL BLUE (TOWEL DISPOSABLE) ×4 IMPLANT
VACURETTE 10 RIGID CVD (CANNULA) IMPLANT
VACURETTE 7MM CVD STRL WRAP (CANNULA) IMPLANT
VACURETTE 8 RIGID CVD (CANNULA) IMPLANT
VACURETTE 9 RIGID CVD (CANNULA) ×2 IMPLANT

## 2011-06-29 NOTE — Progress Notes (Signed)
Pt admitted from OR.  Assessment revealed small pool of blood around perineum.  Steady trickle of blood continued from vagina.  Phone call to Dr. Macon Large to update.  En route.  Additional assessment revealed large clot(size of grapefruit) on peripad with continued bleeding.
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
## 2011-06-29 NOTE — Progress Notes (Signed)
Patient's father called and voiced concerns about patient being discharged and being around the baby.  He said she had mental health issues and alcoholism and feels she needs help.  Patient denied any homicidal or suicidal ideation; and there is no reason to hold her against her will.  It was emphasized that the patient is the one who requested discharge; and given that she is stable, there is no further reason for inpatient observation, and we cannot keep her against her will.   He was told that we are not mental health providers, and she may be better served at [facility] or [facility] Health Services at [facility] or [facility]; patient lives in [place] and had her delivery at [place].    He said he will come to drive her home and is expected to be here in about one hour.  Patient's nurse Delorse Lek) informed of this phone conversation.  Will continue with patient's discharge as planned as long as she remains stable.  Jaynie Collins, M.D. 06/29/2011 6:41 PM

## 2011-06-29 NOTE — Progress Notes (Signed)
Dr Malen Gauze notified of pt's demeanor while awake.  Sedated, but when awakened for assessment, begins writhing and moaning stating she is hurting "down there".  Appears to be very anxious.  Order received for Versed 0.5 mg IV now.
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
## 2011-06-29 NOTE — Progress Notes (Signed)
Dr. Penne Lash and Dr. Macon Large at beside.  Orders received.  U/S ordered and called.  Labs ordered and called.

## 2011-06-29 NOTE — Transfer of Care (Signed)
Immediate Anesthesia Transfer of Care Note  Patient: [facility]  Procedure(s) Performed:  DILATATION AND EVACUATION (D&E)  Patient Location: PACU  Anesthesia Type: MAC  Level of Consciousness: oriented and sedated  Airway & Oxygen Therapy: Patient Spontanous Breathing  Post-op Assessment: Report given to PACU RN and Post -op Vital signs reviewed and stable  Post vital signs: stable  Complications: No apparent anesthesia complications
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
## 2011-06-29 NOTE — Progress Notes (Signed)
Beverly Mason, [facility] spoke with pt in regards to suicidal/ homicidal ideation after pt's father came to pick her up for discharge and voiced concerns about pt's mental status. Pt repeatedly denied both suicidal and homicidal ideation... Pt stated that her mother, who is coming down from [place], is her main support system. The pt also stated she wanted to go home to where he baby is, but was advised to stay the night at her father's house and wait for her mother to arrive. The pt was discharged with her father... The pt was taken to car via wheelchair by [employer]	[place], [place].
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
## 2011-06-29 NOTE — Progress Notes (Signed)
Was called to room by nurse tech. Pt was in bathroom .Pit was pt first time out of bed and she had passed several moderate sized clots. Changed pt undergarments and peripad. No active bleeding after peripad changed. Notified provider of clots and vitals
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
## 2011-06-29 NOTE — Op Note (Signed)
Beverly Mason PROCEDURE DATE: 06/29/2011  PREOPERATIVE DIAGNOSIS: Retained products of conception POSTOPERATIVE DIAGNOSIS: The same. PROCEDURE:     Dilation and Curretage. SURGEON:  Dr. Penne Lash ASSISTANT: Dr Adrian Blackwater  INDICATIONS: 30 y.o. [id number] with retained products of conception at 3 weeks postpartum, needing surgical completion.  Risks of surgery were discussed with the patient including but not limited to: bleeding which may require transfusion; infection which may require antibiotics; injury to uterus or surrounding organs;need for additional procedures including laparotomy or laparoscopy; possibility of intrauterine scarring which may impair future fertility; and other postoperative/anesthesia complications. Written informed consent was obtained.    FINDINGS:  A 12 week size anteverted uterus, moderate amounts of products of conception, specimen sent to pathology.  ANESTHESIA:    Monitored intravenous sedation, paracervical block. INTRAVENOUS FLUIDS:  500 ml of LR ESTIMATED BLOOD LOSS:  Less than 50 ml. SPECIMENS:  Products of conception sent to pathology COMPLICATIONS:  None immediate.  PROCEDURE DETAILS:  The patient received intravenous antibiotics while in the preoperative area.  She was then taken to the operating room where general anesthesia was administered and was found to be adequate.  After an adequate timeout was performed, she was placed in the dorsal lithotomy position and examined; then prepped and draped in the sterile manner.   Her bladder was catheterized for an unmeasured amount of clear, yellow urine. A vaginal speculum was then placed in the patient's vagina and a paracervical block using 1% Marcaine was administered.  A single tooth tenaculum was then applied to the anterior lip of the cervix. The cervix was gently dilated to accommodate a 10 mm suction curette that was gently advanced to the uterine fundus.  The suction device was then activated and curette slowly  rotated to clear the uterus of products of conception.  A sharp curettage was then performed to confirm completer emptying of the uterus.There was minimal bleeding noted and the tenaculum removed with good hemostasis noted.  The patient tolerated the procedure well.  The patient was taken to the recovery area in stable condition.  [name] 06/29/2011 8:05 AM

## 2011-06-29 NOTE — Anesthesia Postprocedure Evaluation (Signed)
Anesthesia Post Note  Patient: Beverly Mason  Procedure(s) Performed:  DILATATION AND EVACUATION (D&E)  Anesthesia type: MAC  Patient location: PACU  Post pain: Pain level controlled  Post assessment: Post-op Vital signs reviewed  Last Vitals:  Filed Vitals:   06/29/11 0830  BP: 111/73  Pulse: 75  Temp:   Resp: 23    Post vital signs: Reviewed  Level of consciousness: sedated  Complications: No apparent anesthesia complications

## 2011-06-29 NOTE — Progress Notes (Signed)
Patient ID: Beverly Mason, female   DOB: 1981/06/20, 30 y.o.   MRN: [id number]   Pt 30 mins s/p D & E and began bledding in PACU.  EBL 300.  Os still open and uterus still slightly boggy.  Methregine, cytotec, and pitocin given.  Dilaudid also given for pain.  Watched pt for 10 mins after bleeding stopped to ensure there was no reoccurrence.  [place] ordered to eval for uterine damage or ret POC.  Will continue to watc hin PACU.  Pt stable and no bleeding at time of note.  Derrico Zhong H. 8:37 AM

## 2011-06-30 ENCOUNTER — Encounter (HOSPITAL_COMMUNITY): Payer: Self-pay | Admitting: Obstetrics & Gynecology

## 2011-07-07 NOTE — Op Note (Signed)
Agree with above note.  [name],[name]. 07/07/2011 12:37 PM

## 2011-08-15 IMAGING — CT CT ABD-PELV W/ CM
2 of 4 series · 16 of 46 positions shown, 18 images · IV contrast (Omnipaque 300)
Comparison: 05/06/2007

CLINICAL DATA: Nausea, vomiting, right upper quadrant pain

CT ABDOMEN AND PELVIS WITH CONTRAST
TECHNIQUE: Multidetector CT imaging of the abdomen and pelvis was
performed following the standard protocol during bolus
administration of intravenous contrast. Breast shield utilized.
Sagittal and coronal MPR images reconstructed from axial data set.
Contrast: Dilute oral contrast and 100 ml 2mnipaque-TUU IV

[Series 2: abd_pel_with 5.0 b40f · axial · 0.59mm/px · z∈[-382,-12]mm · 13 of 82 slices shown, 15 images]
[im 4/82  soft-tissue]
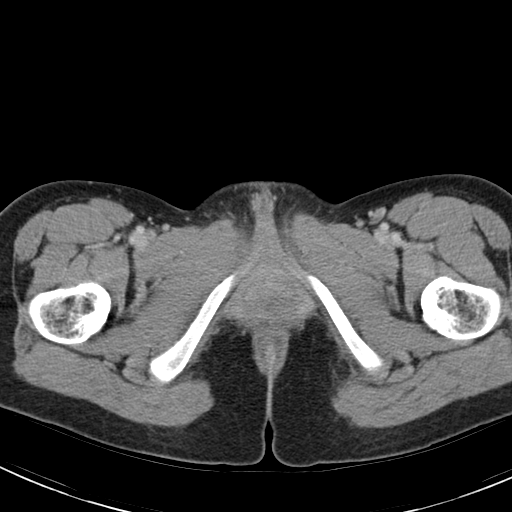
[im 4/82  bone]
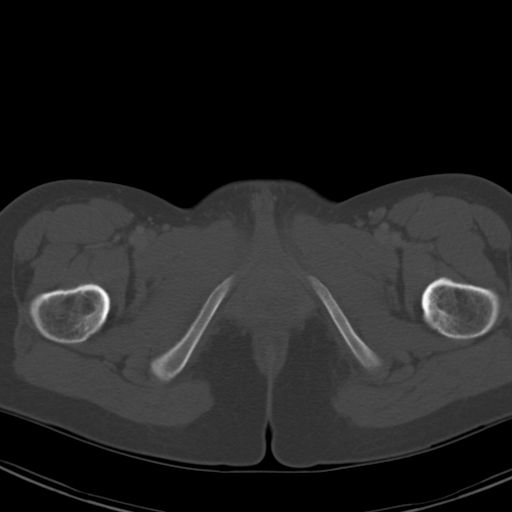
[im 10/82  soft-tissue]
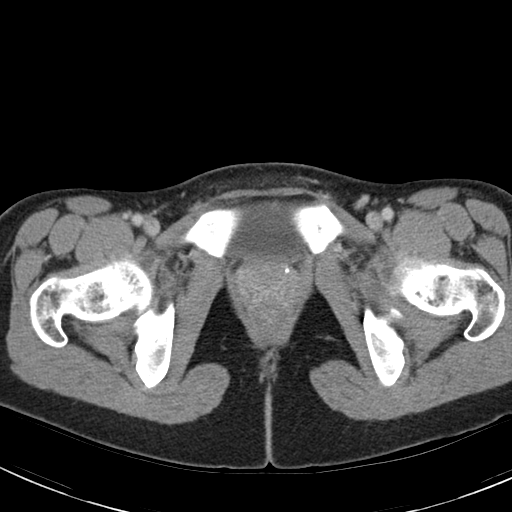
[im 16/82  soft-tissue]
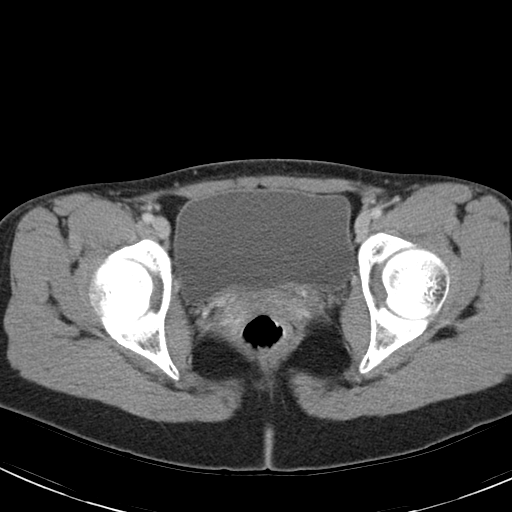
[im 22/82  soft-tissue]
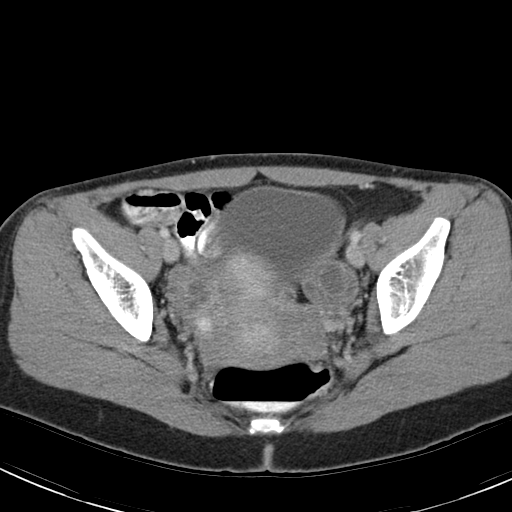
[im 29/82  soft-tissue]
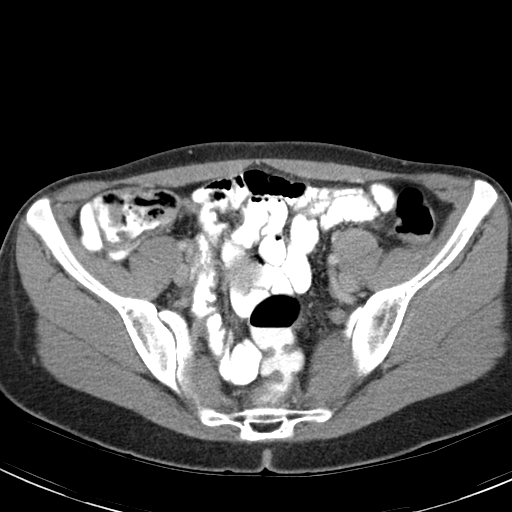
[im 35/82  soft-tissue]
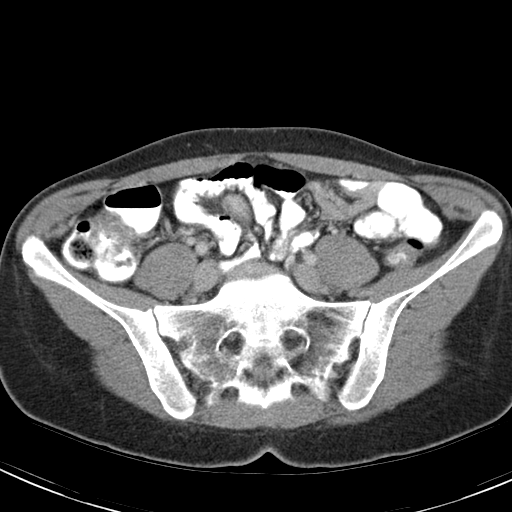
[im 41/82  soft-tissue]
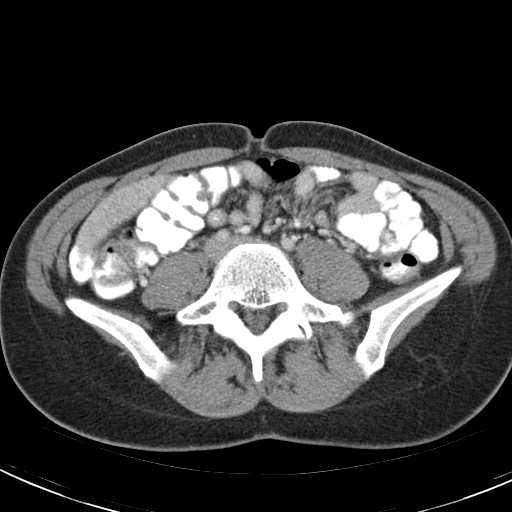
[im 47/82  soft-tissue]
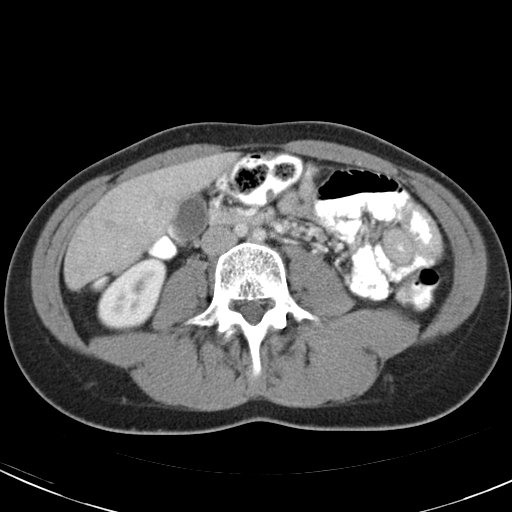
[im 53/82  soft-tissue]
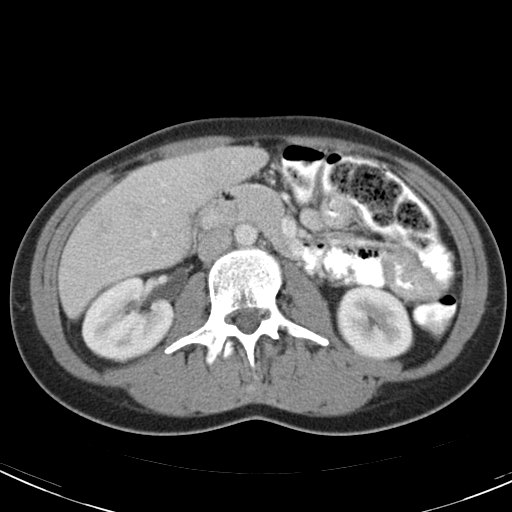
[im 53/82  bone]
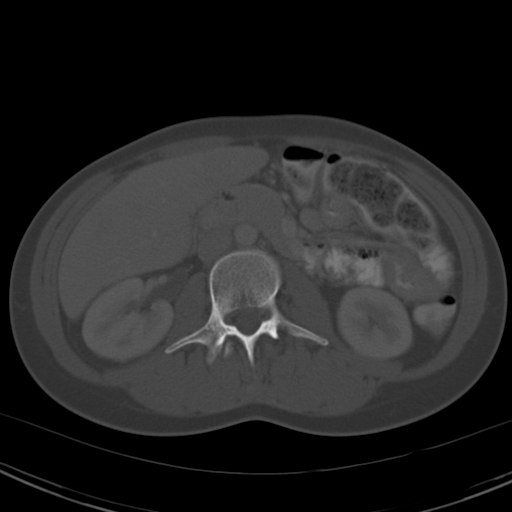
[im 60/82  soft-tissue]
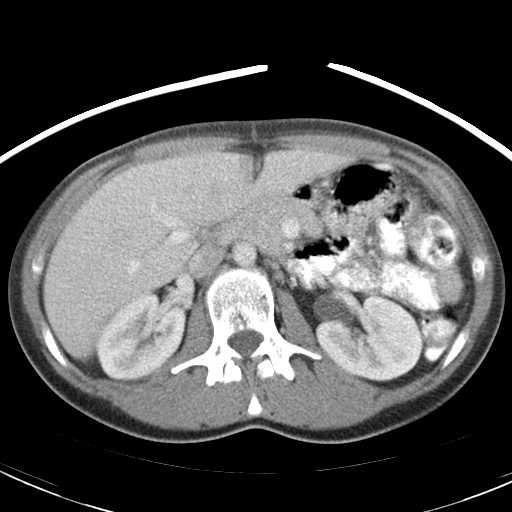
[im 66/82  soft-tissue]
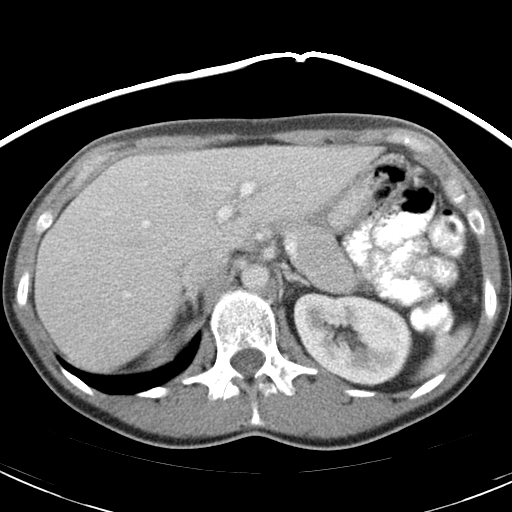
[im 72/82  soft-tissue]
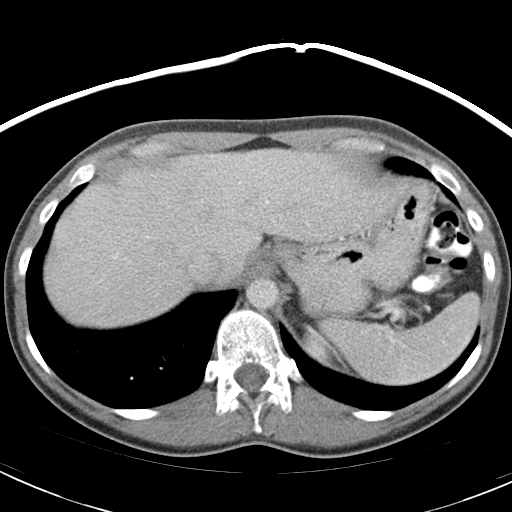
[im 78/82  soft-tissue]
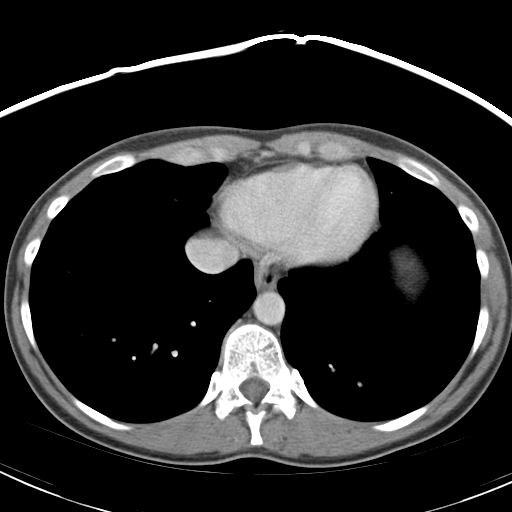

[Series 4: abd_pel_with 3.0 spo cor · coronal · 0.59mm/px · 3 of 61 slices shown]
[im 21/61  soft-tissue]
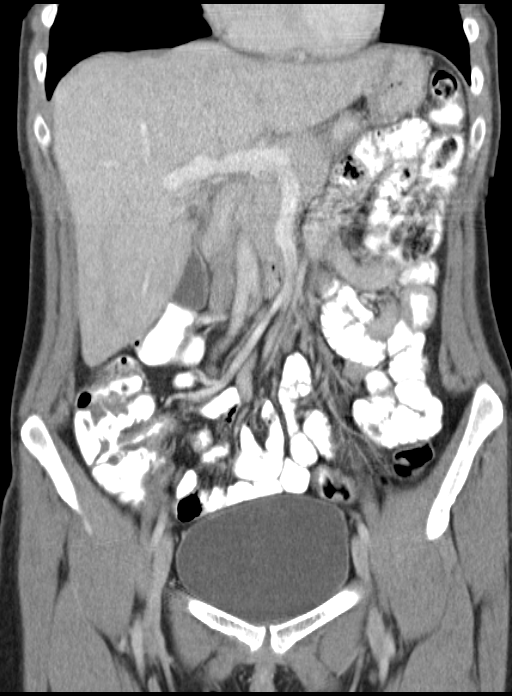
[im 27/61  soft-tissue]
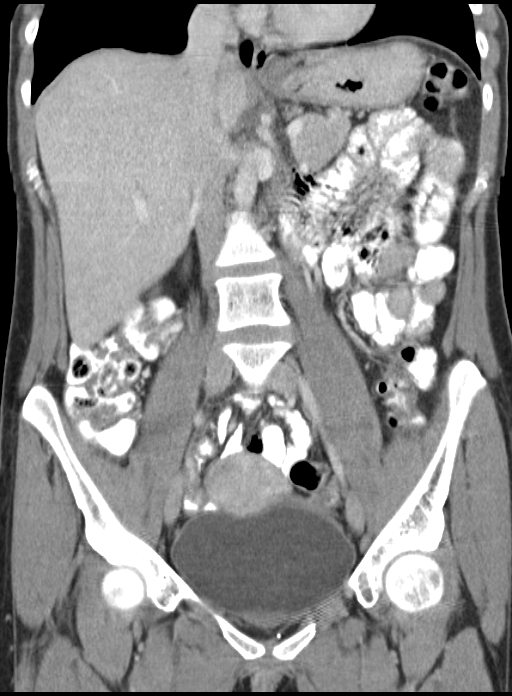
[im 34/61  soft-tissue]
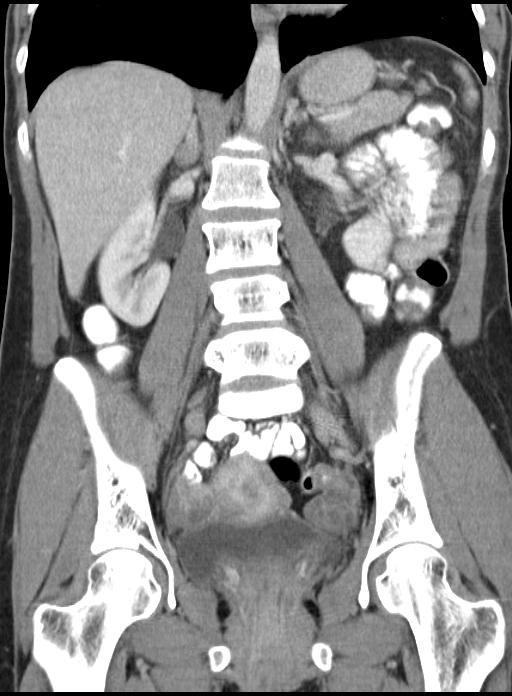

[16 of 46 positions shown; findings below may reference images not displayed]

FINDINGS: Lung bases clear.
Liver, spleen, pancreas, kidneys, and adrenal glands normal
appearance.
Small splenule adjacent to splenic hilum.
Unremarkable CT appearance of gallbladder.
Short segment of appendix is visualized and normal appearance.
Small follicles cysts in ovaries bilaterally.
Unremarkable appearance of bladder and uterus.
Bowel loops unremarkable.
Stomach decompressed, suboptimally assessed, no gross abnormalities
seen.
No mass, adenopathy, free fluid or inflammatory process.
No acute bony findings.
IMPRESSION: No acute intra abdominal abnormalities.

## 2011-10-31 IMAGING — US US TRANSVAGINAL NON-OB
1 series · 14 of 25 positions shown · non-contrast
Comparison: None.

CLINICAL DATA: Pelvic pain.  Vaginal bleeding.  Recent therapeutic
abortion.



[Series 1: us transvaginal non-ob · 0.18mm/px · 14 of 128 slices shown]
[im 1/128]
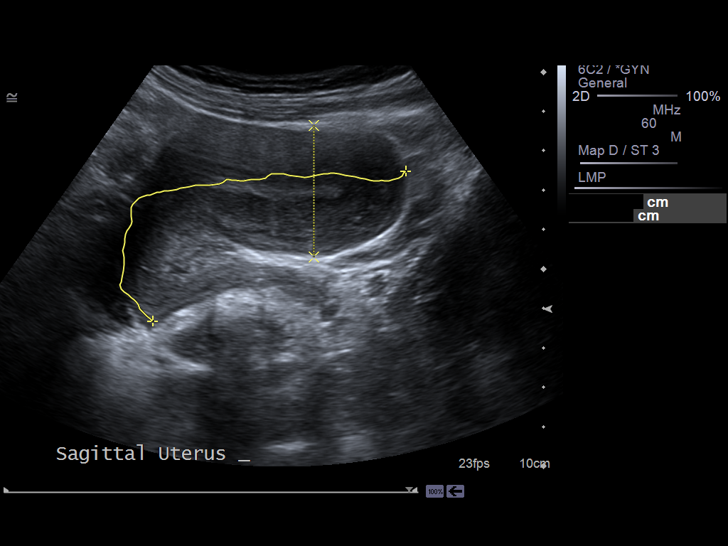
[im 11/128]
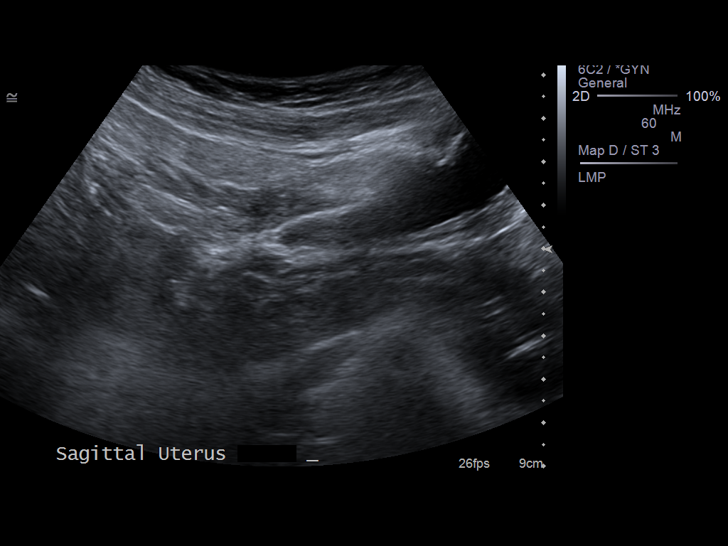
[im 22/128]
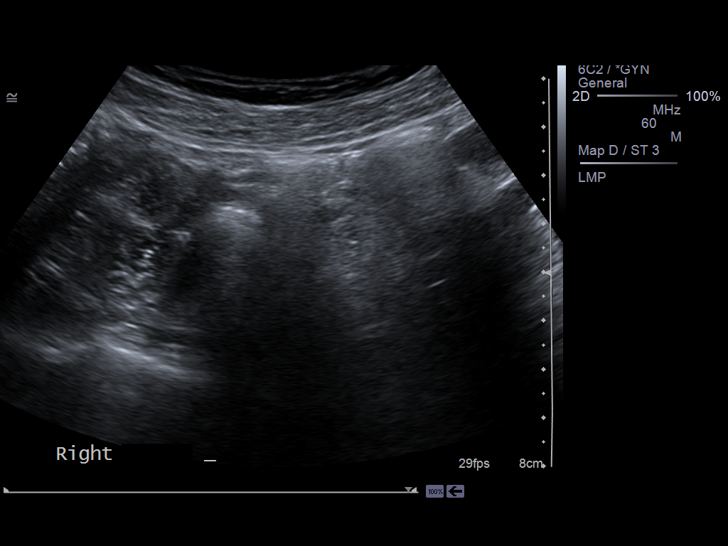
[im 32/128]
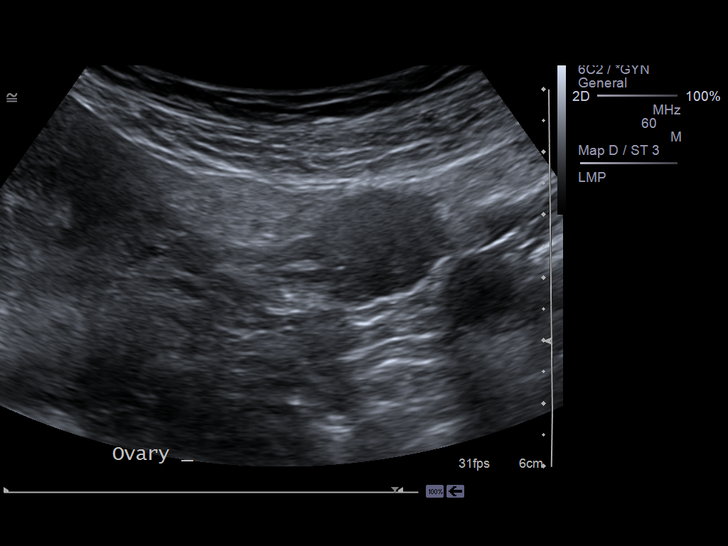
[im 43/128]
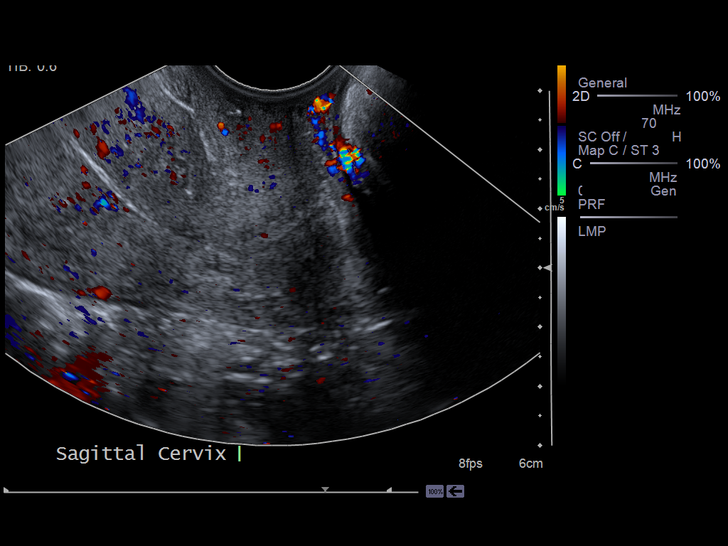
[im 48/128]
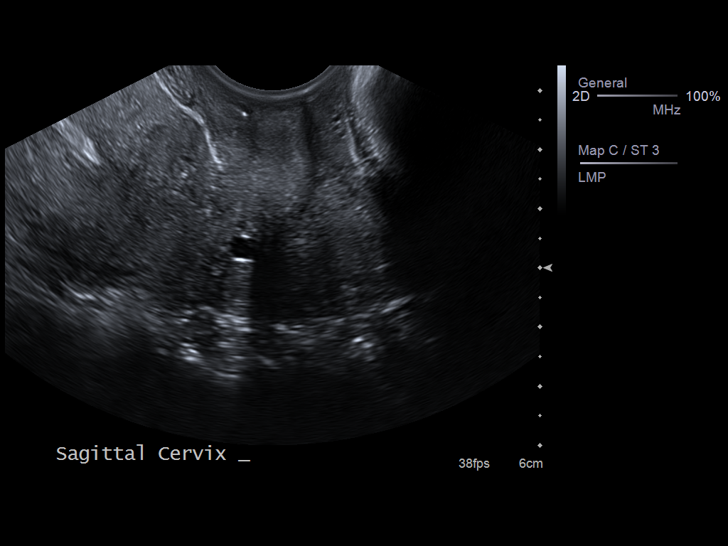
[im 59/128]
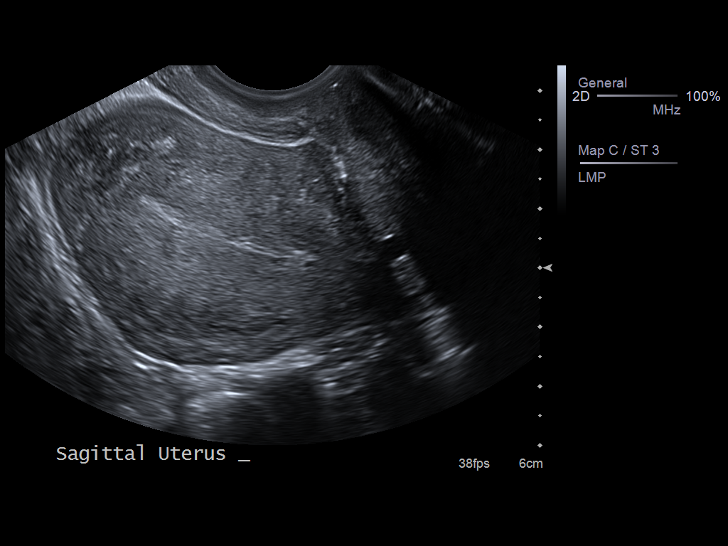
[im 69/128]
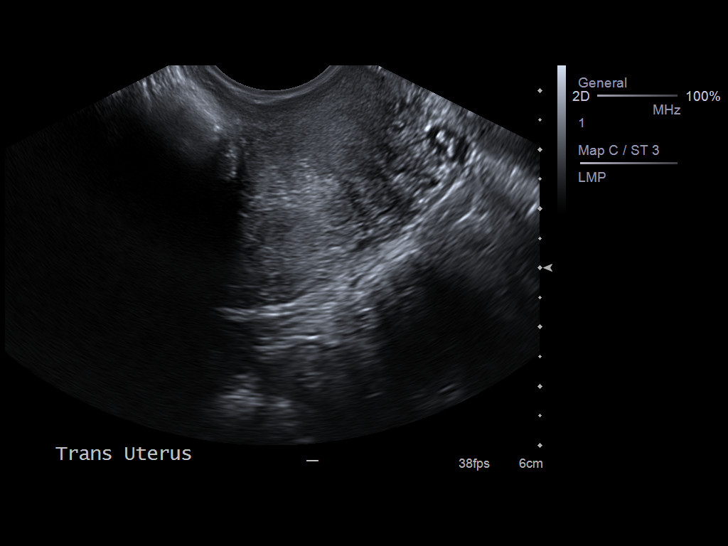
[im 80/128]
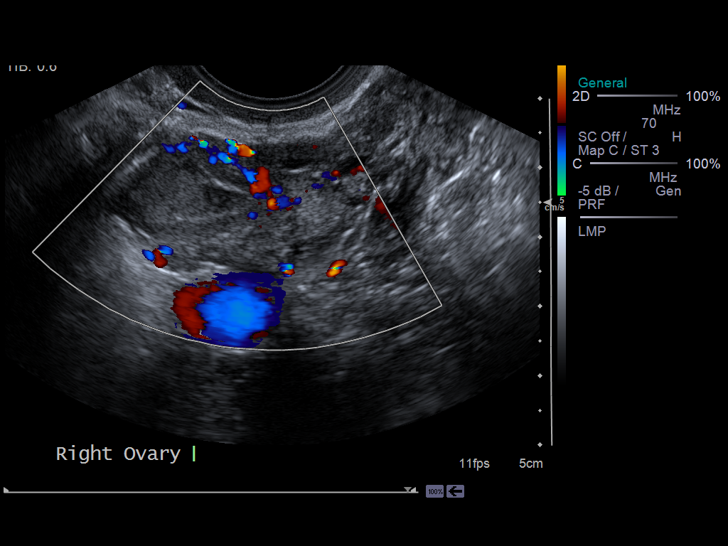
[im 85/128]
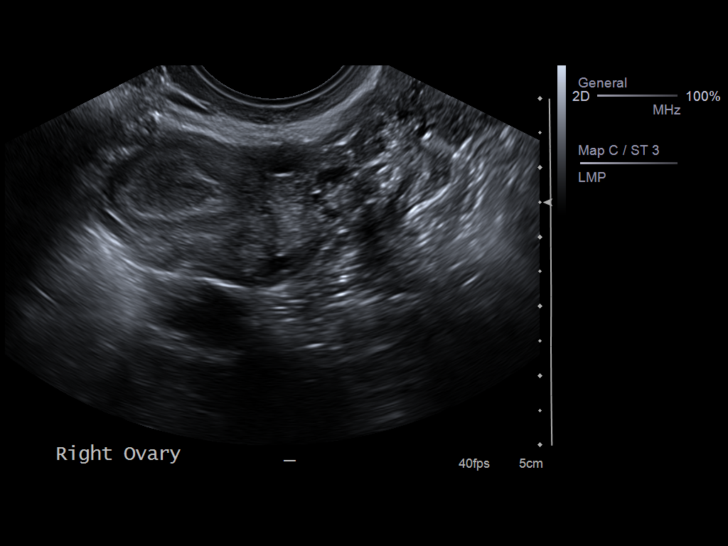
[im 96/128]
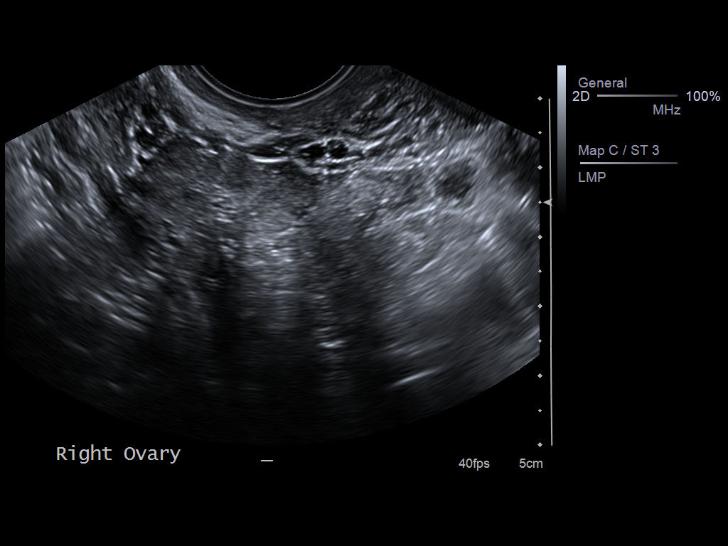
[im 106/128]
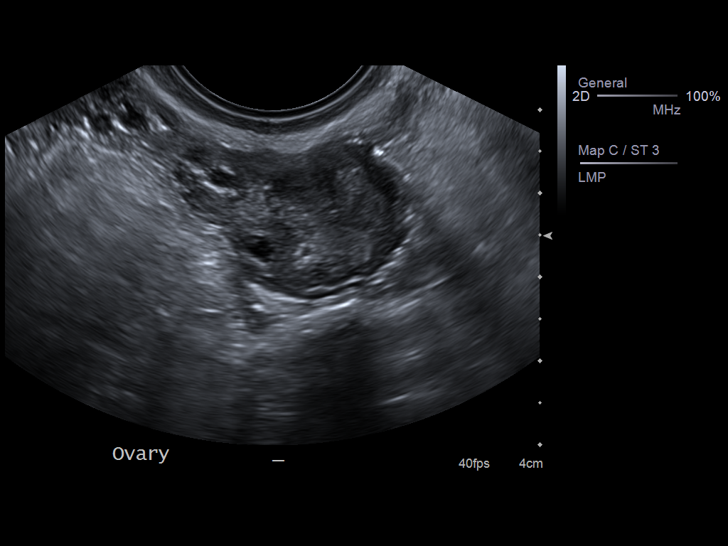
[im 117/128]
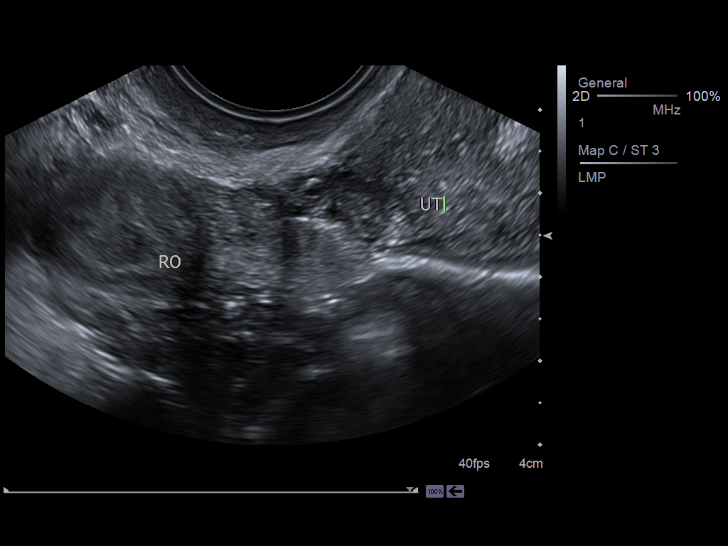
[im 128/128]
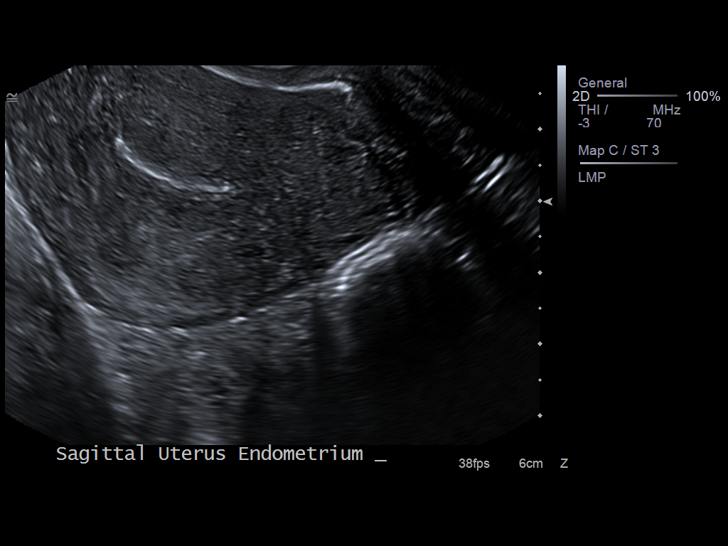

[14 of 25 positions shown; findings below may reference images not displayed]

FINDINGS: Uterus measures 7.3 x 4.4 x 5.3 cm.  Uterus is retroverted. No
fibroids or other uterine masses identified.

Endometrium measures 3 mm in thickness.  Within normal limits in
appearance.

Right Ovary measures 3.4 x 2.0 x 2.4 cm. Small corpus luteum noted.
Otherwise normal appearance.

Left Ovary measures 3.4 x 1.7 x 2.0 cm.  Normal appearance.

Other Findings:  No other abnormality identified.
IMPRESSION: Normal study.  No evidence retained products of conception or other
significant abnormality.

## 2012-01-04 ENCOUNTER — Encounter (HOSPITAL_COMMUNITY): Payer: Self-pay | Admitting: *Deleted

## 2012-01-04 ENCOUNTER — Emergency Department (HOSPITAL_COMMUNITY)
Admission: EM | Admit: 2012-01-04 | Discharge: 2012-01-04 | Discharge status: Against Medical Advice | Payer: Self-pay | Attending: Emergency Medicine | Admitting: Emergency Medicine

## 2012-01-04 DIAGNOSIS — R109 Unspecified abdominal pain: Secondary | ICD-10-CM | POA: Insufficient documentation

## 2012-01-04 HISTORY — DX: Unspecified ovarian cyst, unspecified side: N83.209

## 2012-01-04 NOTE — ED Notes (Signed)
abd pain for 1 month , dx with ovarian cyst 10 days ago. Nausea, no vomting.
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
## 2013-01-13 IMAGING — US US PELVIS COMPLETE
1 series · 13 of 25 positions shown · non-contrast
Comparison: Prior ultrasound of pregnancy performed 02/11/2011,
and CT of the abdomen and pelvis performed 06/27/2011

CLINICAL DATA: 3 weeks postpartum; vaginal bleeding.

TRANSABDOMINAL ULTRASOUND OF PELVIS
TECHNIQUE: Transabdominal ultrasound examination of the pelvis was
performed including evaluation of the uterus, ovaries, adnexal
regions, and pelvic cul-de-sac.

[Series 1: us pelvis complete · 48 acquisitions, 13 frames shown]
[im 1/48]
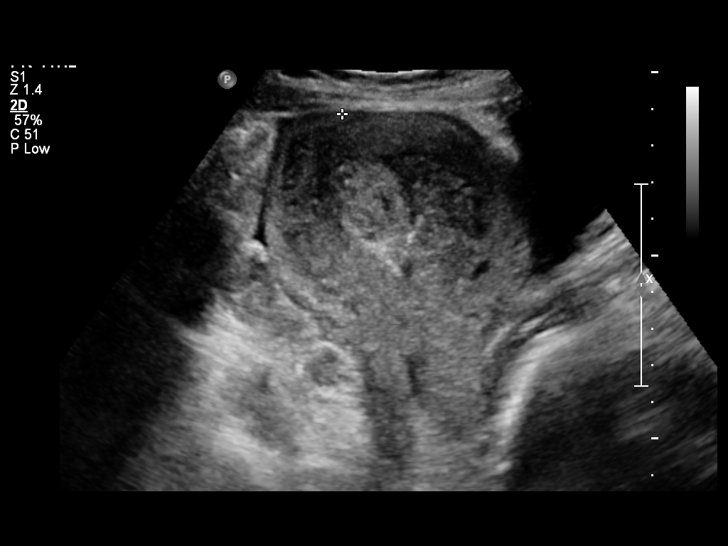
[im 4/48]
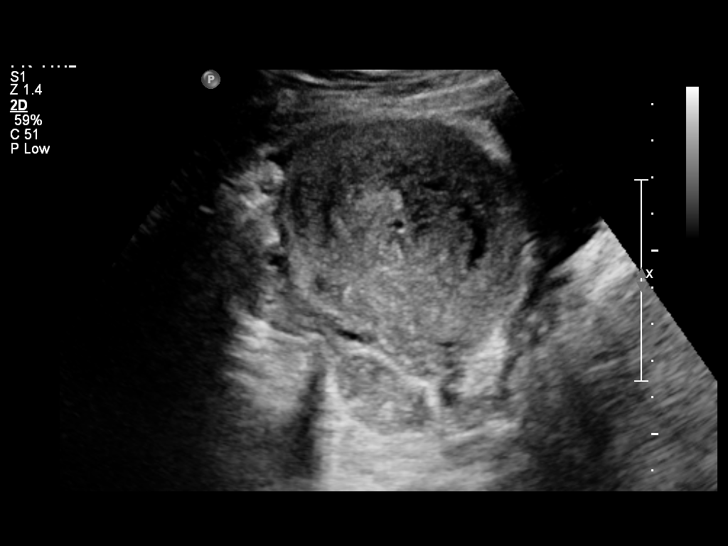
[im 8/48]
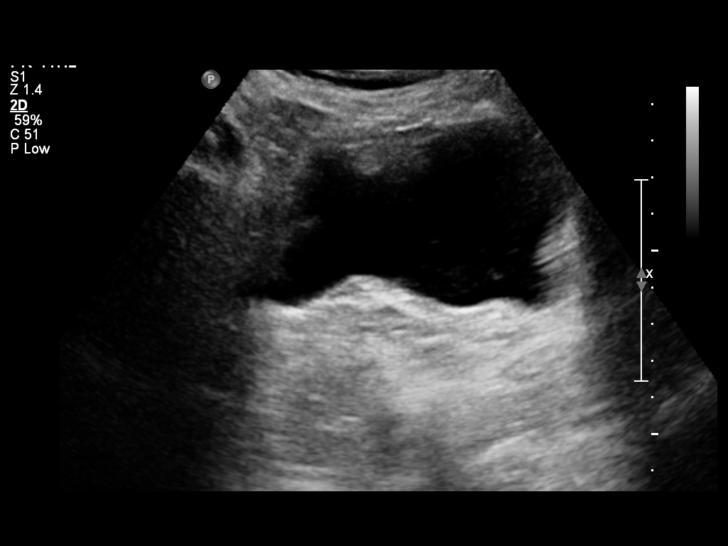
[im 12/48]
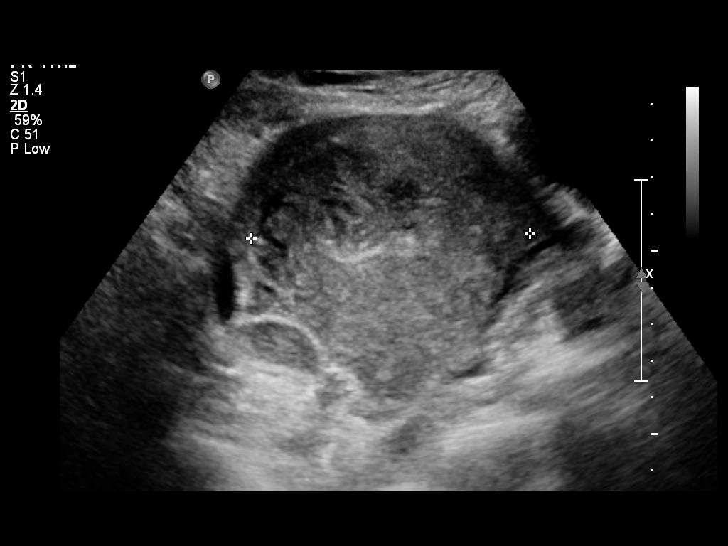
[im 16/48]
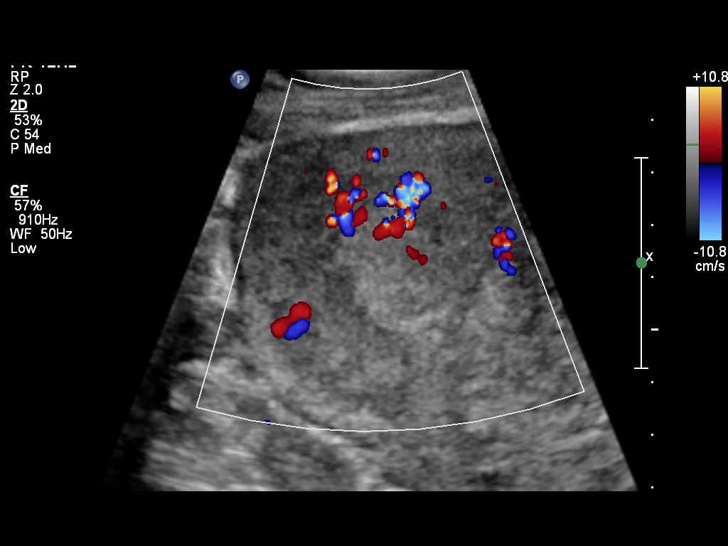
[im 20/48]
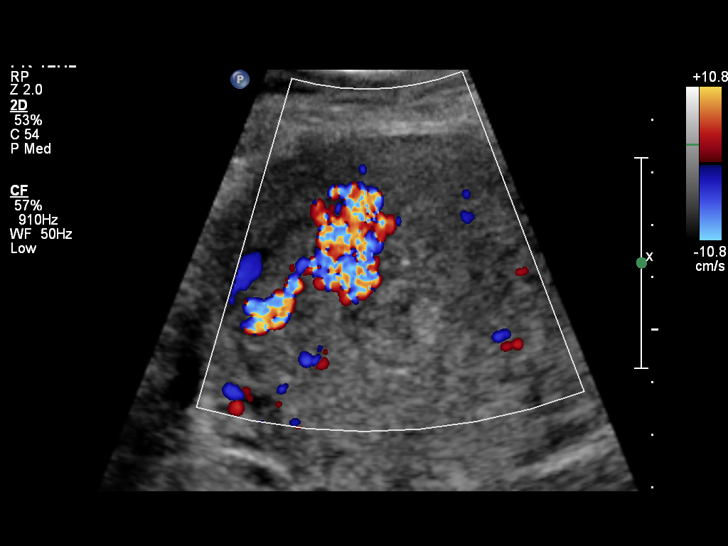
[im 24/48]
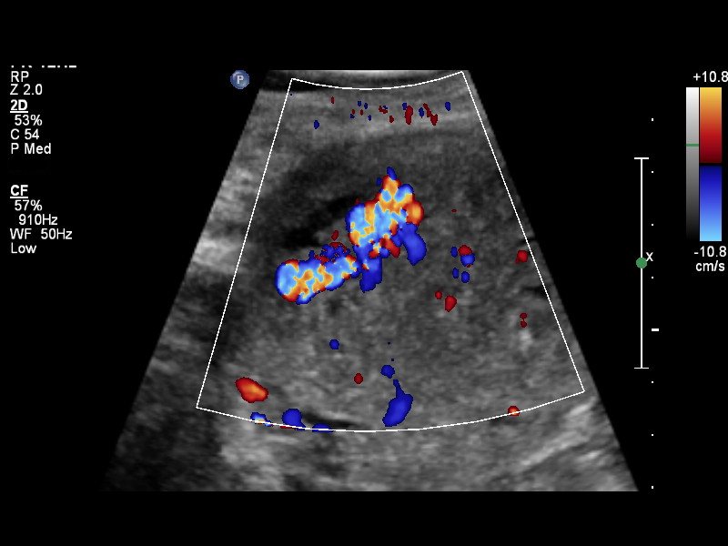
[im 28/48]
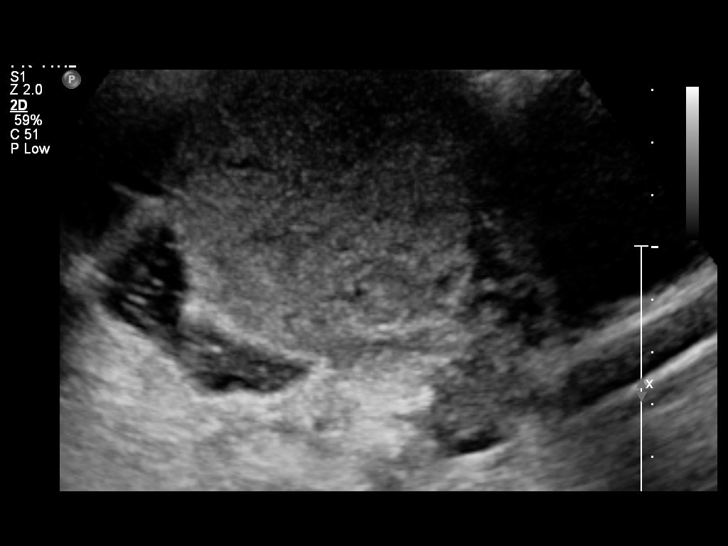
[im 32/48]
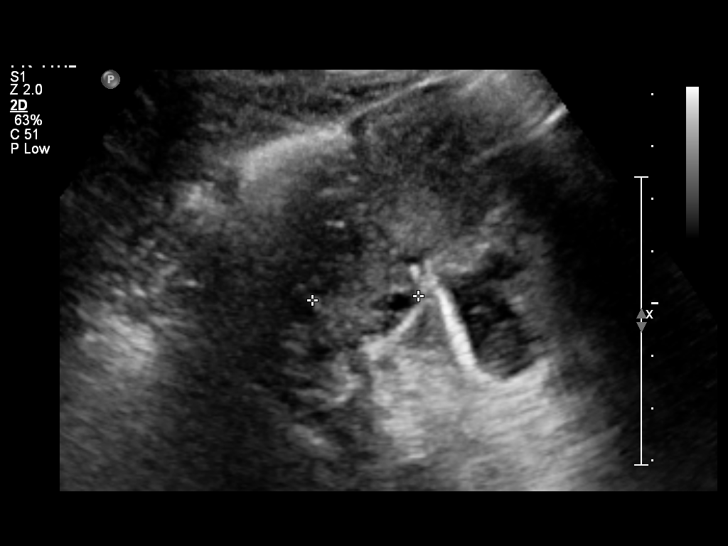
[im 36/48]
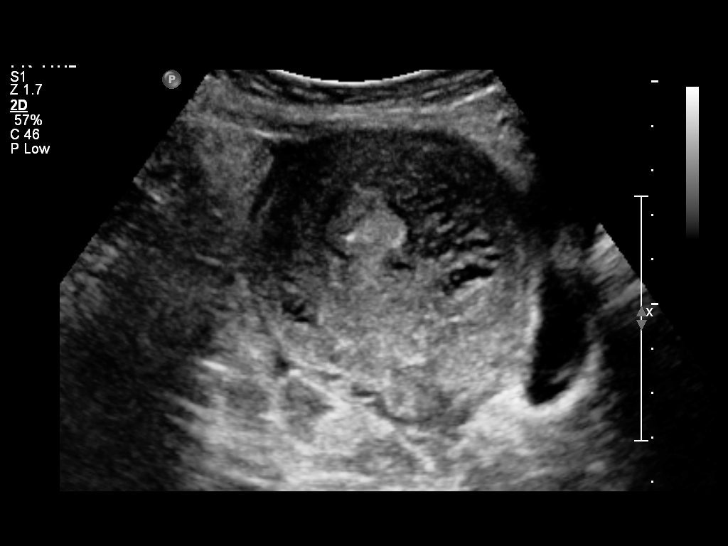
[im 40/48]
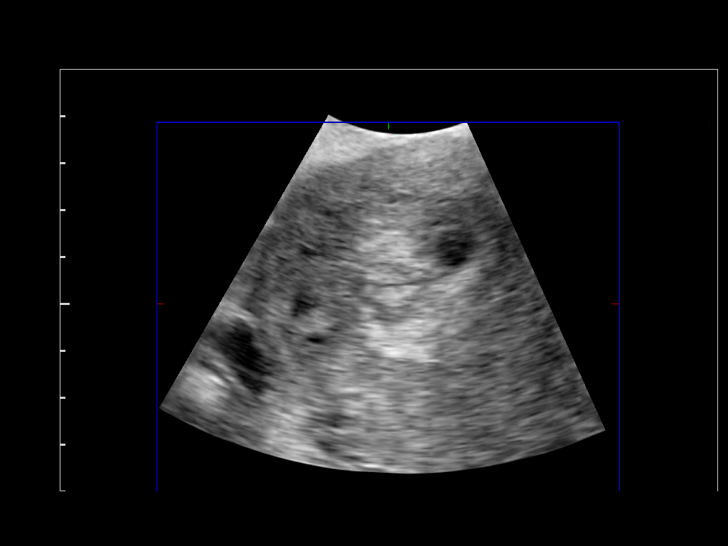
[im 44/48]
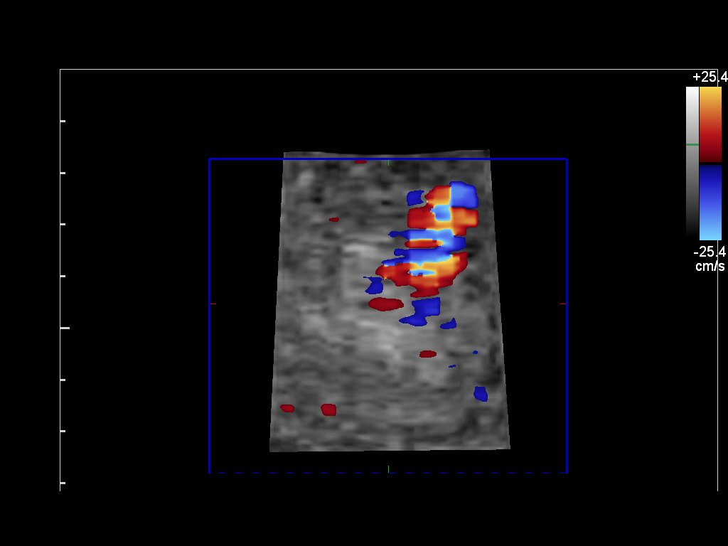
[im 48/48]
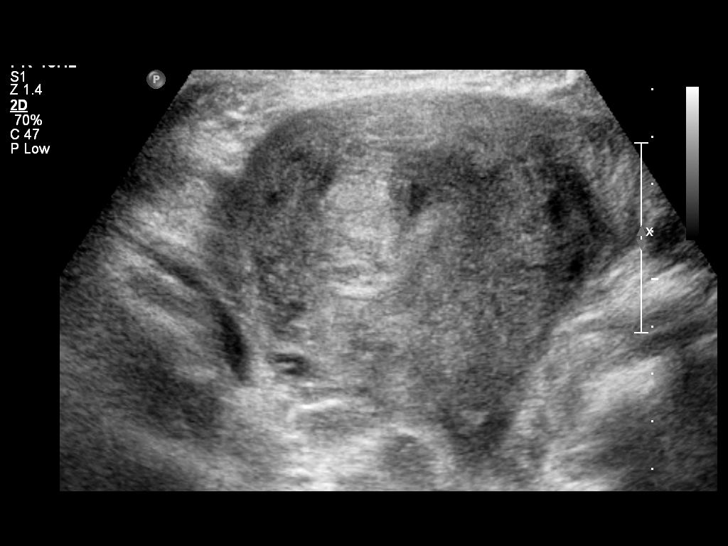

[13 of 25 positions shown; findings below may reference images not displayed]

FINDINGS: Uterus:  Mildly enlarged, measuring 11.1 x 6.6 x 7.6 cm.  There is
a 2.8 x 1.8 x 1.6 cm focus of echogenic tissue along the right side
of the endometrial canal, near the fundus.  This demonstrates
significant hyperemia, and raises suspicion for retained products
of conception, given the recent postpartum state.

Endometrium: Echogenic material surrounding the soft tissue on the
right side and atrial canal likely reflects blood.

Right ovary: Normal appearance/no adnexal mass; measures 2.2 x
x 2.0 cm.

Left ovary: Normal appearance/no adnexal mass; measures 2.6 x 1.8 x
1.8 cm.

Other Findings:  No free fluid seen within the pelvic cul-de-sac.
IMPRESSION: 1.  2.8 cm focus of echogenic tissue along the right side of the
endometrial canal , near the fundus.  This demonstrates significant
hyperemia, suspicious for retained products of conception.
2.  Small amount of surrounding blood noted within the endometrial
canal.

Findings were discussed with Lando RN at [facility]HOS[facility] at

## 2014-05-27 ENCOUNTER — Encounter (HOSPITAL_COMMUNITY): Payer: Self-pay | Admitting: *Deleted

## 2018-02-14 ENCOUNTER — Other Ambulatory Visit: Payer: Self-pay

## 2018-02-14 ENCOUNTER — Emergency Department (HOSPITAL_BASED_OUTPATIENT_CLINIC_OR_DEPARTMENT_OTHER): Payer: Self-pay

## 2018-02-14 ENCOUNTER — Emergency Department (HOSPITAL_BASED_OUTPATIENT_CLINIC_OR_DEPARTMENT_OTHER)
Admission: EM | Admit: 2018-02-14 | Discharge: 2018-02-14 | Disposition: A | Discharge status: Home / Self Care | Payer: Self-pay | Attending: Emergency Medicine | Admitting: Emergency Medicine

## 2018-02-14 ENCOUNTER — Encounter (HOSPITAL_BASED_OUTPATIENT_CLINIC_OR_DEPARTMENT_OTHER): Payer: Self-pay | Admitting: *Deleted

## 2018-02-14 DIAGNOSIS — F17210 Nicotine dependence, cigarettes, uncomplicated: Secondary | ICD-10-CM | POA: Insufficient documentation

## 2018-02-14 DIAGNOSIS — R519 Headache, unspecified: Secondary | ICD-10-CM

## 2018-02-14 DIAGNOSIS — J0120 Acute ethmoidal sinusitis, unspecified: Secondary | ICD-10-CM | POA: Insufficient documentation

## 2018-02-14 DIAGNOSIS — R51 Headache: Secondary | ICD-10-CM

## 2018-02-14 MED ORDER — METOCLOPRAMIDE HCL 10 MG PO TABS
10.0000 mg | ORAL_TABLET | Freq: Once | ORAL | Status: AC
  Administered 2018-02-14: 10 mg via ORAL
  Filled 2018-02-14: qty 1

## 2018-02-14 MED ORDER — METOCLOPRAMIDE HCL 10 MG PO TABS: 10.0000 mg | ORAL_TABLET | Freq: Three times a day (TID) | ORAL | 0 refills | Status: DC | PRN

## 2018-02-14 MED ORDER — ACETAMINOPHEN 500 MG PO TABS
1000.0000 mg | ORAL_TABLET | Freq: Once | ORAL | Status: AC
  Administered 2018-02-14: 1000 mg via ORAL
  Filled 2018-02-14: qty 2

## 2018-02-14 MED ORDER — AZITHROMYCIN 250 MG PO TABS: 250.0000 mg | ORAL_TABLET | Freq: Every day | ORAL | 0 refills | Status: AC

## 2018-02-14 MED ORDER — KETOROLAC TROMETHAMINE 30 MG/ML IJ SOLN: 30.0000 mg | Freq: Once | INTRAMUSCULAR | Status: DC

## 2018-02-14 MED ORDER — DIPHENHYDRAMINE HCL 25 MG PO CAPS
25.0000 mg | ORAL_CAPSULE | Freq: Once | ORAL | Status: AC
Start: 2018-02-14 — End: 2018-02-14
  Administered 2018-02-14: 25 mg via ORAL
  Filled 2018-02-14: qty 1

## 2018-02-14 NOTE — ED Notes (Signed)
Pt states she is here because she can tylenol at home. MD made aware

## 2018-02-14 NOTE — Discharge Instructions (Signed)
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
## 2018-02-14 NOTE — ED Triage Notes (Signed)
Pt c/o h/a " off and on" x 1 month.

## 2018-05-30 ENCOUNTER — Emergency Department (HOSPITAL_BASED_OUTPATIENT_CLINIC_OR_DEPARTMENT_OTHER)
Admission: EM | Admit: 2018-05-30 | Discharge: 2018-05-30 | Disposition: A | Discharge status: Home / Self Care | Payer: Self-pay | Attending: Emergency Medicine | Admitting: Emergency Medicine

## 2018-05-30 ENCOUNTER — Other Ambulatory Visit: Payer: Self-pay

## 2018-05-30 ENCOUNTER — Emergency Department (HOSPITAL_BASED_OUTPATIENT_CLINIC_OR_DEPARTMENT_OTHER): Payer: Self-pay

## 2018-05-30 ENCOUNTER — Encounter (HOSPITAL_BASED_OUTPATIENT_CLINIC_OR_DEPARTMENT_OTHER): Payer: Self-pay | Admitting: *Deleted

## 2018-05-30 DIAGNOSIS — F17210 Nicotine dependence, cigarettes, uncomplicated: Secondary | ICD-10-CM | POA: Insufficient documentation

## 2018-05-30 DIAGNOSIS — R109 Unspecified abdominal pain: Secondary | ICD-10-CM

## 2018-05-30 DIAGNOSIS — R7989 Other specified abnormal findings of blood chemistry: Secondary | ICD-10-CM

## 2018-05-30 DIAGNOSIS — R1011 Right upper quadrant pain: Secondary | ICD-10-CM

## 2018-05-30 DIAGNOSIS — E876 Hypokalemia: Secondary | ICD-10-CM

## 2018-05-30 DIAGNOSIS — K760 Fatty (change of) liver, not elsewhere classified: Secondary | ICD-10-CM

## 2018-05-30 DIAGNOSIS — R945 Abnormal results of liver function studies: Secondary | ICD-10-CM | POA: Insufficient documentation

## 2018-05-30 DIAGNOSIS — Z79899 Other long term (current) drug therapy: Secondary | ICD-10-CM | POA: Insufficient documentation

## 2018-05-30 LAB — URINALYSIS, ROUTINE W REFLEX MICROSCOPIC
Glucose, UA: NEGATIVE mg/dL
KETONES UR: 15 mg/dL — AB
LEUKOCYTES UA: NEGATIVE
Nitrite: NEGATIVE
PH: 7 (ref 5.0–8.0)
Protein, ur: 30 mg/dL — AB
Specific Gravity, Urine: 1.02 (ref 1.005–1.030)

## 2018-05-30 LAB — URINALYSIS, MICROSCOPIC (REFLEX): WBC UA: NONE SEEN WBC/hpf (ref 0–5)

## 2018-05-30 LAB — COMPREHENSIVE METABOLIC PANEL
ALBUMIN: 4.2 g/dL (ref 3.5–5.0)
ALK PHOS: 62 U/L (ref 38–126)
ALT: 60 U/L — AB (ref 0–44)
AST: 142 U/L — AB (ref 15–41)
Anion gap: 11 (ref 5–15)
BILIRUBIN TOTAL: 1.4 mg/dL — AB (ref 0.3–1.2)
BUN: <5 mg/dL — ABNORMAL LOW (ref 6–20)
CHLORIDE: 99 mmol/L (ref 98–111)
CO2: 24 mmol/L (ref 22–32)
CREATININE: 0.73 mg/dL (ref 0.44–1.00)
Calcium: 8.8 mg/dL — ABNORMAL LOW (ref 8.9–10.3)
GFR calc Af Amer: >60 mL/min (ref >60)
Glucose, Bld: 91 mg/dL (ref 70–99)
POTASSIUM: 3 mmol/L — AB (ref 3.5–5.1)
Sodium: 134 mmol/L — ABNORMAL LOW (ref 135–145)
Total Protein: 7.2 g/dL (ref 6.5–8.1)

## 2018-05-30 LAB — PREGNANCY, URINE: PREG TEST UR: NEGATIVE

## 2018-05-30 LAB — CBC
HEMATOCRIT: 44.4 % (ref 36.0–46.0)
Hemoglobin: 15.2 g/dL — ABNORMAL HIGH (ref 12.0–15.0)
MCH: 35 pg — ABNORMAL HIGH (ref 26.0–34.0)
MCHC: 34.2 g/dL (ref 30.0–36.0)
MCV: 102.3 fL — ABNORMAL HIGH (ref 80.0–100.0)
NRBC: 0 % (ref 0.0–0.2)
PLATELETS: 281 10*3/uL (ref 150–400)
RBC: 4.34 MIL/uL (ref 3.87–5.11)
RDW: 12 % (ref 11.5–15.5)
WBC: 5.9 10*3/uL (ref 4.0–10.5)

## 2018-05-30 LAB — LIPASE, BLOOD: Lipase: 36 U/L (ref 11–51)

## 2018-05-30 MED ORDER — POTASSIUM CHLORIDE ER 10 MEQ PO TBCR: 40.0000 meq | EXTENDED_RELEASE_TABLET | Freq: Every day | ORAL | 0 refills | Status: AC

## 2018-05-30 MED ORDER — MORPHINE SULFATE (PF) 4 MG/ML IV SOLN
4.0000 mg | Freq: Once | INTRAVENOUS | Status: AC
  Administered 2018-05-30: 4 mg via INTRAVENOUS
  Filled 2018-05-30: qty 1

## 2018-05-30 MED ORDER — SODIUM CHLORIDE 0.9 % IV BOLUS
1000.0000 mL | Freq: Once | INTRAVENOUS | Status: AC
  Administered 2018-05-30: 1000 mL via INTRAVENOUS

## 2018-05-30 MED ORDER — METOCLOPRAMIDE HCL 10 MG PO TABS: 10.0000 mg | ORAL_TABLET | Freq: Four times a day (QID) | ORAL | 0 refills | Status: DC

## 2018-05-30 MED ORDER — ONDANSETRON HCL 4 MG/2ML IJ SOLN
4.0000 mg | Freq: Once | INTRAMUSCULAR | Status: AC
  Administered 2018-05-30: 4 mg via INTRAVENOUS
  Filled 2018-05-30: qty 2

## 2018-05-30 MED ORDER — METOCLOPRAMIDE HCL 10 MG PO TABS: 10.0000 mg | ORAL_TABLET | Freq: Four times a day (QID) | ORAL | 0 refills | Status: AC

## 2018-05-30 MED ORDER — METOCLOPRAMIDE HCL 5 MG/ML IJ SOLN
10.0000 mg | Freq: Once | INTRAMUSCULAR | Status: AC
  Administered 2018-05-30: 10 mg via INTRAVENOUS
  Filled 2018-05-30: qty 2

## 2018-05-30 MED ORDER — ONDANSETRON HCL 4 MG/2ML IJ SOLN
4.0000 mg | Freq: Once | INTRAMUSCULAR | Status: AC | PRN
  Administered 2018-05-30: 4 mg via INTRAVENOUS
  Filled 2018-05-30: qty 2

## 2018-05-30 MED ORDER — TRAMADOL HCL 50 MG PO TABS: 50.0000 mg | ORAL_TABLET | Freq: Four times a day (QID) | ORAL | 0 refills | Status: AC | PRN

## 2018-05-30 NOTE — ED Notes (Signed)
pts family member came to nursing desk requesting water or ice chips for the patient. RN checked with provider and informed patient that she was unable to eat or drink until provider gets results. Mouth swab was provided for comfort

## 2018-05-30 NOTE — ED Notes (Signed)
Patient transported to Ultrasound 

## 2018-05-30 NOTE — Discharge Instructions (Addendum)
You were seen in the ER for right upper abdominal pain, right flank pain, nausea, vomiting, diarrhea.   Liver function numbers were elevated.  Ultrasound of liver and gallbladder showed mild fatty and scar tissue accumulation around liver (steatosis).  You need follow up with primary care doctor or gastroenterologist in 7-10 days for re-evaluation of liver numbers.    There was tiny amount of blood in urine but no infection.  You declined CT of kidneys to rule out kidney stone.    Take tramadol for pain.  Avoid tylenol. Reglan for nausea.  Your potassium levels were slightly low (this happens from vomiting and diarrhea), take potassium as prescribed. Increased potassium rich foods.   Return to ER for worsening pain, vomiting, burning with urination, blood in urine

## 2018-05-30 NOTE — ED Notes (Signed)
ED Provider at bedside. 

## 2018-05-30 NOTE — ED Provider Notes (Signed)
[facility] EMERGENCY DEPARTMENT Provider Note   CSN: [id number] Arrival date & time: 05/30/18  1653     History   Chief Complaint Chief Complaint  Patient presents with  . Abdominal Pain    HPI Beverly Mason is a 37 y.o. female is here for evaluation of abdominal pain.  Onset Saturday.  Localized to the epigastric, right upper quadrant with radiation to the right flank.  States that it feels like somebody shot her through and through.  The pain is constant.  Associated with nausea, vomiting, diarrhea, decreased appetite, sweats, chills, dark urine.  She is having 2-3 episodes of vomiting and up to 10 episodes of diarrhea daily.  Diarrhea is watery, nonbloody non-melanotic.  She has been taken ginger ale, Pepcid, Tums without relief.  Previous history of bilateral tubal ligation, no other abdominal surgeries.  History of tobacco use.  Reports "occasional" EtOH consumption, last 3 weeks ago approximately 2-3 beers a couple of times a week.  Patient has documented history of alcohol use disorder.  Family at bedside states pt takes a lot of tylenol, almost daily.  She denies any recent travel, antibiotics, chest pain, shortness of breath, cough, sore throat, dysuria, urgency, frequency.  No alleviating factors.  No aggravating factors. No h/o kidney stones.   HPI  Past Medical History:  Diagnosis Date  . Anxiety   . Depression   . Ovarian cyst     Patient Active Problem List   Diagnosis Date Noted  . S/P dilation and evacuation for retained POCS 06/29/2011    Past Surgical History:  Procedure Laterality Date  . DILATION AND EVACUATION  06/29/2011   Procedure: DILATATION AND EVACUATION (D&E);  Surgeon: Lesly Dukes, MD;  Location: [facility];  Service: Gynecology;  Laterality: N/A;  . TUBAL LIGATION       OB History    Gravida  3   Para  2   Term  2   Preterm  0   AB  0   Living  2     SAB  0   TAB  0   Ectopic  0   Multiple  0   Live Births              Home Medications    Prior to Admission medications   Medication Sig Start Date End Date Taking? Authorizing Provider  azithromycin (ZITHROMAX) 250 MG tablet Take 1 tablet (250 mg total) by mouth daily. Take first 2 tablets together, then 1 every day until finished. 02/14/18   Long, Arlyss Repress, MD  metoCLOPramide (REGLAN) 10 MG tablet Take 1 tablet (10 mg total) by mouth every 6 (six) hours. 05/30/18   Liberty Handy, PA-C  potassium chloride (K-DUR) 10 MEQ tablet Take 4 tablets (40 mEq total) by mouth daily for 10 days. 05/30/18 06/09/18  Liberty Handy, PA-C  ranitidine (ZANTAC) 150 MG tablet Take 150 mg by mouth 2 (two) times daily.    [provider]  traMADol (ULTRAM) 50 MG tablet Take 1 tablet (50 mg total) by mouth every 6 (six) hours as needed for up to 3 days. 05/30/18 06/02/18  Liberty Handy, PA-C    Family History No family history on file.  Social History Social History   Tobacco Use  . Smoking status: Current Some Day Smoker    Packs/day: 1.00    Years: 4.00    Pack years: 4.00  . Smokeless tobacco: Never Used  Substance Use Topics  . Alcohol  use: Yes    Alcohol/week: 2.0 standard drinks    Types: 2 Cans of beer per week  . Drug use: No     Allergies   Penicillins; Amoxicillin; Aspirin; and Ibuprofen   Review of Systems Review of Systems  Constitutional: Positive for chills and diaphoresis.  Gastrointestinal: Positive for abdominal pain, diarrhea, nausea and vomiting.  Genitourinary: Positive for flank pain.  All other systems reviewed and are negative.    Physical Exam Updated Vital Signs BP 132/78 (BP Location: Right Arm)   Pulse 84   Temp 98.2 F (36.8 C) (Oral)   Resp 16   Ht 5\' 6"  (1.676 m)   Wt 59 kg   LMP 05/29/2018   SpO2 100%   BMI 20.98 kg/m   Physical Exam  Constitutional: She is oriented to person, place, and time. She appears well-developed and well-nourished.  Non toxic  HENT:  Head: Normocephalic and  atraumatic.  Nose: Nose normal.  Top of the tongue is dry.  Mucous membranes moist.  Eyes: Pupils are equal, round, and reactive to light. Conjunctivae and EOM are normal.  Neck: Normal range of motion.  Cardiovascular: Normal rate and regular rhythm.  Pulmonary/Chest: Effort normal and breath sounds normal.  Abdominal: Soft. Bowel sounds are normal. There is tenderness in the right upper quadrant and epigastric area. There is CVA tenderness.  Mild epigastric, right upper quadrant and right CVA tenderness.  No guarding, rigidity.  Positive Murphy's.  No suprapubic tenderness.  Negative McBurney's.  Active bowel sounds to lower quadrants.  Musculoskeletal: Normal range of motion.  Neurological: She is alert and oriented to person, place, and time.  Skin: Skin is warm and dry. Capillary refill takes less than 2 seconds.  Psychiatric: She has a normal mood and affect. Her behavior is normal.  Nursing note and vitals reviewed.    ED Treatments / Results  Labs (all labs ordered are listed, but only abnormal results are displayed) Labs Reviewed  URINALYSIS, ROUTINE W REFLEX MICROSCOPIC - Abnormal; Notable for the following components:      Result Value   APPearance HAZY (*)    Hgb urine dipstick TRACE (*)    Bilirubin Urine MODERATE (*)    Ketones, ur 15 (*)    Protein, ur 30 (*)    All other components within normal limits  URINALYSIS, MICROSCOPIC (REFLEX) - Abnormal; Notable for the following components:   Bacteria, UA RARE (*)    All other components within normal limits  COMPREHENSIVE METABOLIC PANEL - Abnormal; Notable for the following components:   Sodium 134 (*)    Potassium 3.0 (*)    BUN <5 (*)    Calcium 8.8 (*)    AST 142 (*)    ALT 60 (*)    Total Bilirubin 1.4 (*)    All other components within normal limits  CBC - Abnormal; Notable for the following components:   Hemoglobin 15.2 (*)    MCV 102.3 (*)    MCH 35.0 (*)    All other components within normal limits    GASTROINTESTINAL PANEL BY PCR, STOOL (REPLACES STOOL CULTURE)  URINE CULTURE  PREGNANCY, URINE  LIPASE, BLOOD    EKG None  Radiology US Abdomen Limited Ruq  Result Date: 05/30/2018 CLINICAL DATA:  Initial evaluation for acute nausea, vomiting, diarrhea, elevated LFTs EXAM: ULTRASOUND ABDOMEN LIMITED RIGHT UPPER QUADRANT COMPARISON:  None. FINDINGS: Gallbladder: No gallstones or wall thickening visualized. No sonographic Murphy sign noted by sonographer. Common bile duct: Diameter: 2.5  mm Liver: No focal lesion identified. Diffusely increased echogenicity within the hepatic parenchyma, consistent with steatosis. Portal vein is patent on color Doppler imaging with normal direction of blood flow towards the liver. IMPRESSION: 1. Normal sonographic appearance of the gallbladder. No cholelithiasis or imaging findings to suggest acute cholecystitis. 2. No biliary dilatation. 3. Increased echogenicity within the patent parenchyma, consistent with steatosis. Electronically Signed   By: Rise Mu M.D.   On: 05/30/2018 21:03    Procedures Procedures (including critical care time)  Medications Ordered in ED Medications  ondansetron (ZOFRAN) injection 4 mg (4 mg Intravenous Given 05/30/18 1809)  ondansetron (ZOFRAN) injection 4 mg (4 mg Intravenous Given 05/30/18 1838)  sodium chloride 0.9 % bolus 1,000 mL ( Intravenous Stopped 05/30/18 1946)  morphine 4 MG/ML injection 4 mg (4 mg Intravenous Given 05/30/18 1838)  metoCLOPramide (REGLAN) injection 10 mg (10 mg Intravenous Given 05/30/18 2027)  morphine 4 MG/ML injection 4 mg (4 mg Intravenous Given 05/30/18 2027)     Initial Impression / Assessment and Plan / ED Course  I have reviewed the triage vital signs and the nursing notes.  Pertinent labs & imaging results that were available during my care of the patient were reviewed by me and considered in my medical decision making (see chart for details).  Clinical Course as of May 30 2237   Tue May 30, 2018  1820 Bacteria, UA(!): RARE [CG]  1820 Hyaline Casts, UA: PRESENT [CG]  1821 Bilirubin Urine(!): MODERATE [CG]  1821 Hgb urine dipstick(!): TRACE [CG]  1821 Ketones, ur(!): 15 [CG]  1821 Protein(!): 30 [CG]  1928 Potassium(!): 3.0 [CG]  1928 AST(!): 142 [CG]  1928 ALT(!): 60 [CG]  1928 Total Bilirubin(!): 1.4 [CG]    Clinical Course User Index [CG] Liberty Handy, PA-C    Differential diagnosis includes viral gastroenteritis versus cholecystitis versus pancreatitis versus acute hepatitis infection being less likely.  She denies hematemesis, melena or hematochezia.  She does not have urinary symptoms but has noticed darker urine and has mild right CVA tenderness.  This pain could be referred from gallbladder or from renal source.  Will obtain screening labs, urinalysis, provide supportive care.  1955: K3.0.  AST 142, ALT 60, total bilirubin 1.4.  UA with moderate bilirubin, ketones, proteins, trace hemoglobin but no RBCs, only rare bacteria.  Negative pregnancy.  Normal creatinine, WBC, lipase.  Repeated abdominal exam w persistent right upper quadrant abdominal pain.  Given labs and exam, will obtain right upper quadrant ultrasound.  2233: RUQ [place] with steatosis otherwise negative. Discussed results with pt.  On repeat abd exam she continues to have RUQ and R CVAT.  Although no urinary symptoms, and UA with only trace hgb/0-5 RBCs, I did recommend CT renal study as she has continued R CVAT despite morphine to r/o stone.  Pt declined states she does not have insurance and is concerned about cost.  She does not have UTI symptoms and UA w/o convincing infection, will send for culture and defer abx. LFTs and steatosis likely from APAP/ETOH use.  Recommended f/u with GI/PCP for recheck of LFTs and further monitoring.  DC with reglan and tramadol to avoid NSAID use.  Strict return precautions discussed with pt and family at bedside. She is aware of s/s that would warrant return to  ER.   Final Clinical Impressions(s) / ED Diagnoses   Final diagnoses:  LFT elevation  Right upper quadrant abdominal pain  Right flank pain  Steatosis of liver  Hypokalemia  ED Discharge Orders         Ordered    metoCLOPramide (REGLAN) 10 MG tablet  Every 6 hours,   Status:  Discontinued     05/30/18 2159    potassium chloride (K-DUR) 10 MEQ tablet  Daily     05/30/18 2159    traMADol (ULTRAM) 50 MG tablet  Every 6 hours PRN     05/30/18 2207    metoCLOPramide (REGLAN) 10 MG tablet  Every 6 hours     05/30/18 2207           Liberty Handy, PA-C 05/30/18 2238    Little, Ambrose Finland, MD 05/30/18 [phone]

## 2018-05-30 NOTE — ED Notes (Signed)
Provider notified RN to give patient drink for PO challenge. RN gave patient a ginger ale

## 2018-05-30 NOTE — ED Triage Notes (Signed)
Abdominal pain, dizziness, nausea and diarrhea. She has a headache that started yesterday. She has been vomiting x 3 days.

## 2018-05-31 LAB — GASTROINTESTINAL PANEL BY PCR, STOOL (REPLACES STOOL CULTURE)

## 2019-11-18 ENCOUNTER — Emergency Department (HOSPITAL_COMMUNITY)
Admission: EM | Admit: 2019-11-18 | Discharge: 2019-11-18 | Disposition: A | Discharge status: Home / Self Care | Payer: Self-pay | Attending: Emergency Medicine | Admitting: Emergency Medicine

## 2019-11-18 ENCOUNTER — Encounter (HOSPITAL_COMMUNITY): Payer: Self-pay | Admitting: Emergency Medicine

## 2019-11-18 DIAGNOSIS — S41119A Laceration without foreign body of unspecified upper arm, initial encounter: Secondary | ICD-10-CM | POA: Insufficient documentation

## 2019-11-18 DIAGNOSIS — Y939 Activity, unspecified: Secondary | ICD-10-CM | POA: Insufficient documentation

## 2019-11-18 DIAGNOSIS — Y999 Unspecified external cause status: Secondary | ICD-10-CM | POA: Insufficient documentation

## 2019-11-18 DIAGNOSIS — Y929 Unspecified place or not applicable: Secondary | ICD-10-CM | POA: Insufficient documentation

## 2019-11-18 DIAGNOSIS — Z5321 Procedure and treatment not carried out due to patient leaving prior to being seen by health care provider: Secondary | ICD-10-CM | POA: Insufficient documentation

## 2019-11-18 DIAGNOSIS — X58XXXA Exposure to other specified factors, initial encounter: Secondary | ICD-10-CM | POA: Insufficient documentation
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
## 2019-11-18 NOTE — ED Triage Notes (Signed)
Pt now stating she did not have syncopal episode, wound unwrapped and redressed, very jagged laceration to posterior forearm, bleeding controlled. Pt requesting a pain pill, stating she took Tylenol 3 hours ago and is allergic to all NSAIDS

## 2019-11-18 NOTE — ED Notes (Signed)
Patient came to desk and said was ready to leave
